# Patient Record
Sex: Female | Born: 1975
Health system: Southern US, Community
[De-identification: ages and names within clinical notes are randomized; demographics above are authoritative.]

## PROBLEM LIST (undated history)

## (undated) HISTORY — PX: APPENDECTOMY: SHX54

---

## 2000-12-17 ENCOUNTER — Encounter: Payer: Self-pay | Admitting: Emergency Medicine

## 2000-12-17 ENCOUNTER — Emergency Department (HOSPITAL_COMMUNITY): Admission: EM | Admit: 2000-12-17 | Discharge: 2000-12-17 | Payer: Self-pay | Admitting: Emergency Medicine

## 2006-10-23 ENCOUNTER — Emergency Department (HOSPITAL_COMMUNITY): Admission: EM | Admit: 2006-10-23 | Discharge: 2006-10-23 | Payer: Self-pay | Admitting: Emergency Medicine

## 2008-05-02 ENCOUNTER — Ambulatory Visit (HOSPITAL_COMMUNITY): Admission: RE | Admit: 2008-05-02 | Discharge: 2008-05-02 | Payer: Self-pay | Admitting: Unknown Physician Specialty

## 2008-05-11 ENCOUNTER — Inpatient Hospital Stay (HOSPITAL_COMMUNITY): Admission: AD | Admit: 2008-05-11 | Discharge: 2008-05-11 | Payer: Self-pay | Admitting: Obstetrics and Gynecology

## 2009-04-20 ENCOUNTER — Inpatient Hospital Stay (HOSPITAL_COMMUNITY): Admission: AD | Admit: 2009-04-20 | Discharge: 2009-04-23 | Payer: Self-pay | Admitting: Obstetrics and Gynecology

## 2009-10-16 IMAGING — US US OB TRANSVAGINAL MODIFY
1 series · 14 of 28 positions shown · non-contrast
Comparison: none

OBSTETRICAL ULTRASOUND:
 This ultrasound exam was performed in the [HOSPITAL] Ultrasound Department.  The OB US report was generated in the AS system, and faxed to the ordering physician.  This report is also available in [REDACTED] PACS.

[Series 1: us ob transvaginal modify · 0.26mm/px · 14 of 42 slices shown]
[im 2/42]
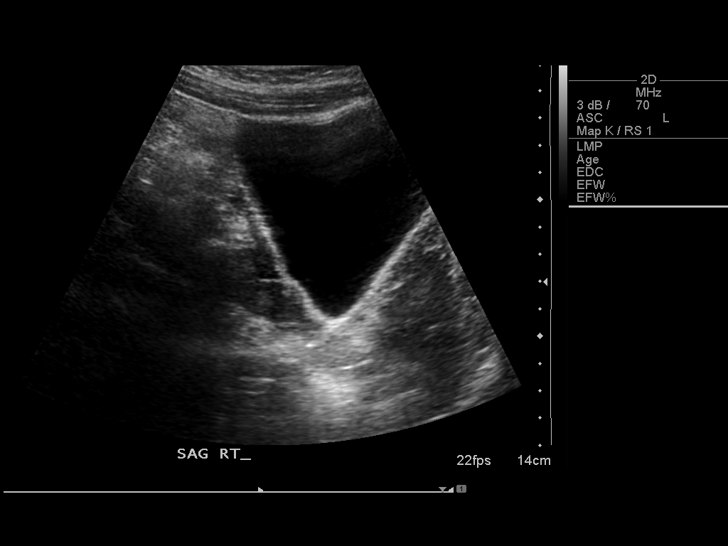
[im 5/42]
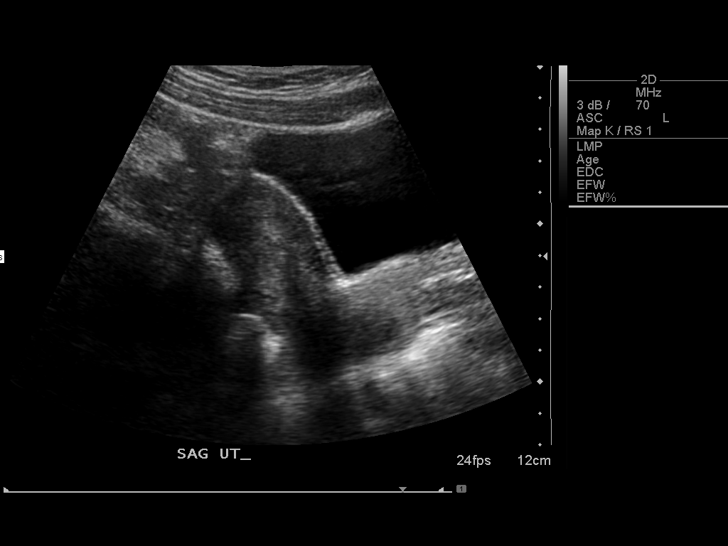
[im 8/42]
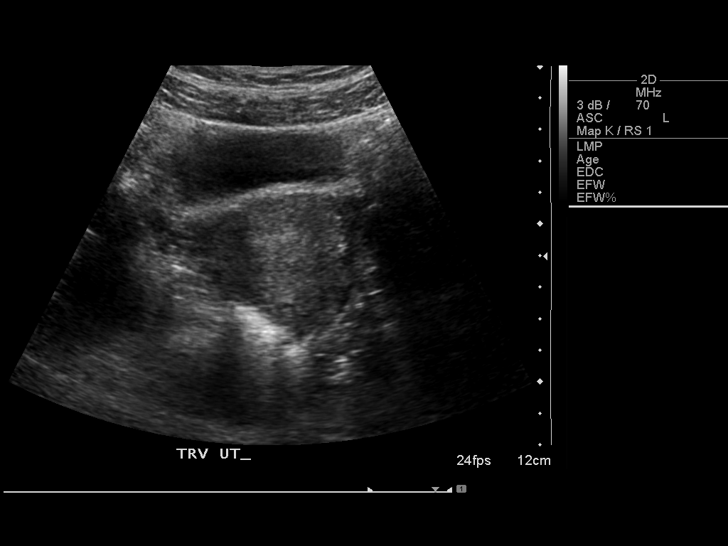
[im 11/42]
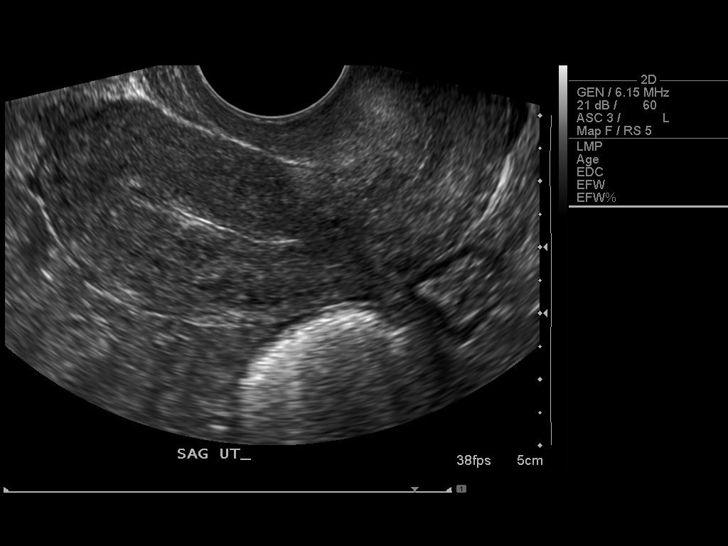
[im 14/42]
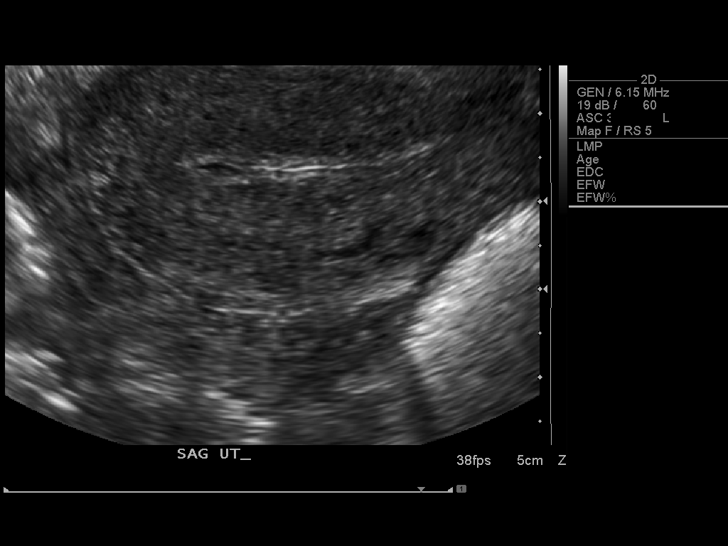
[im 17/42]
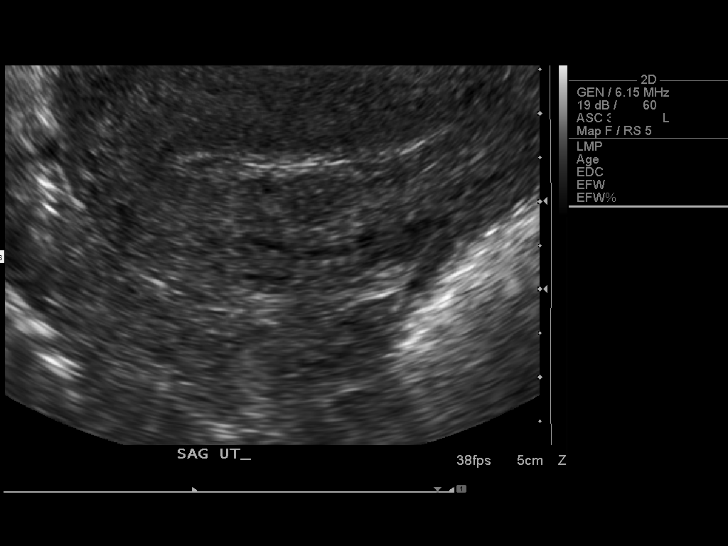
[im 20/42]
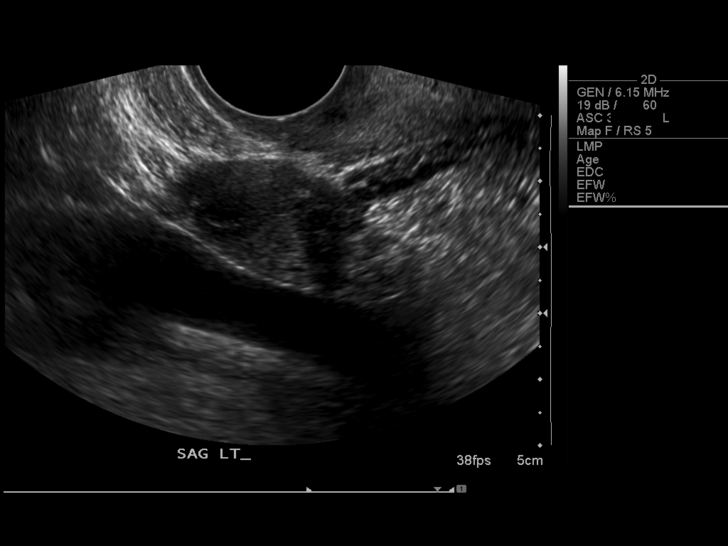
[im 23/42]
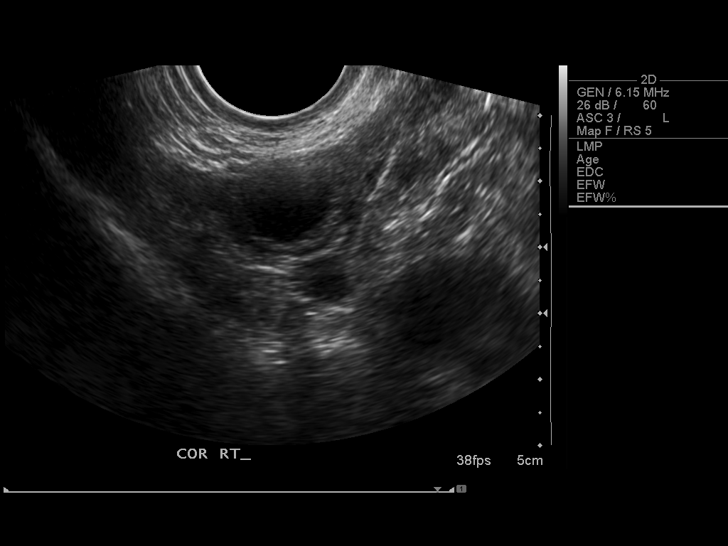
[im 26/42]
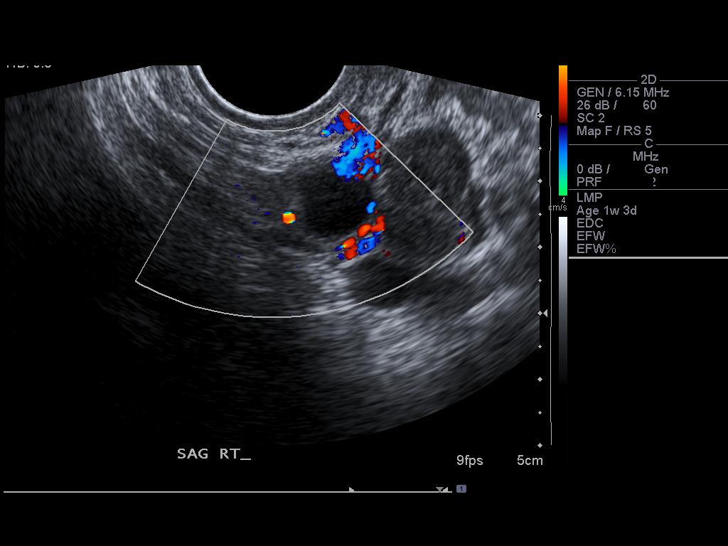
[im 29/42]
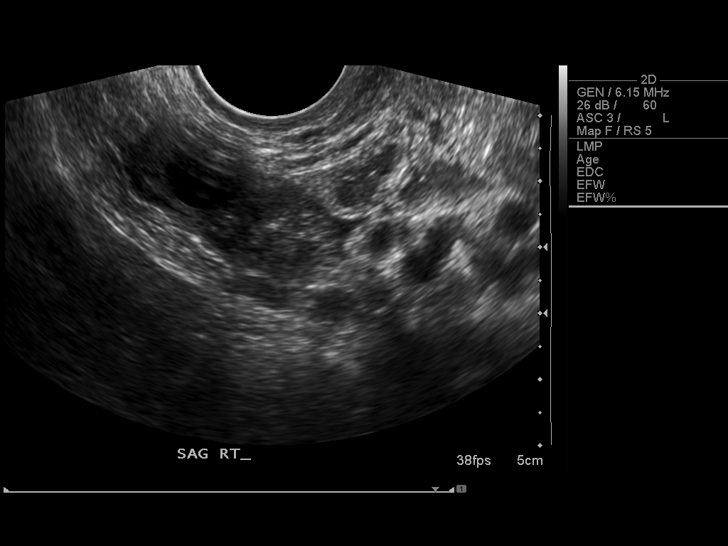
[im 32/42]
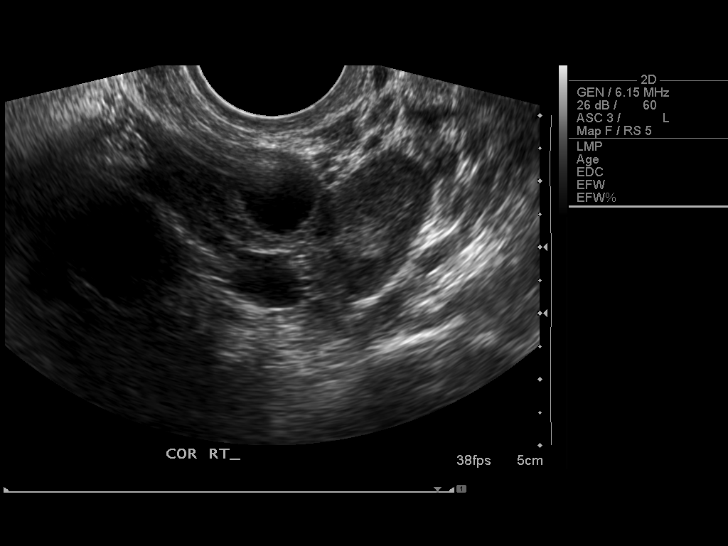
[im 35/42]
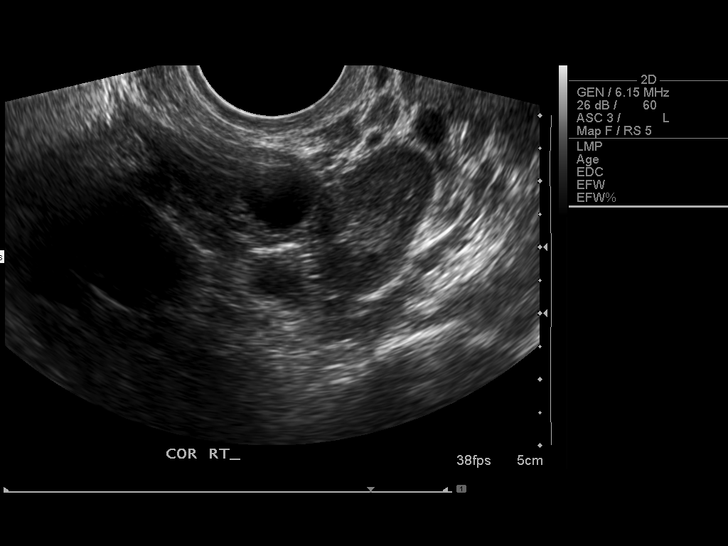
[im 38/42]
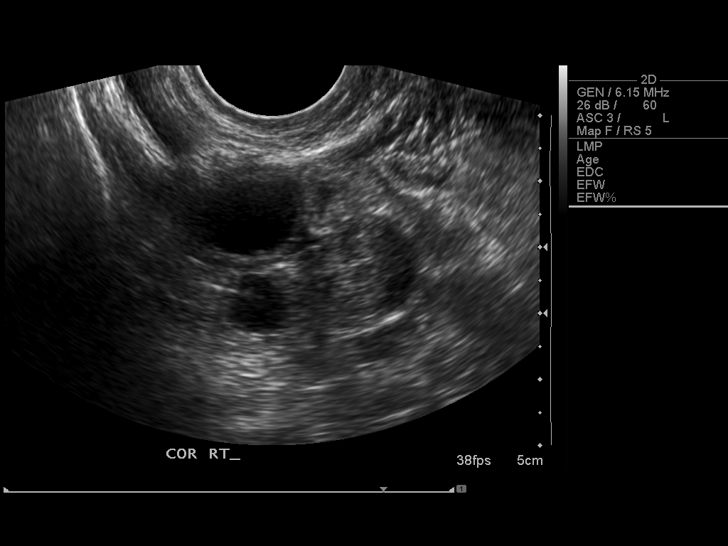
[im 42/42]
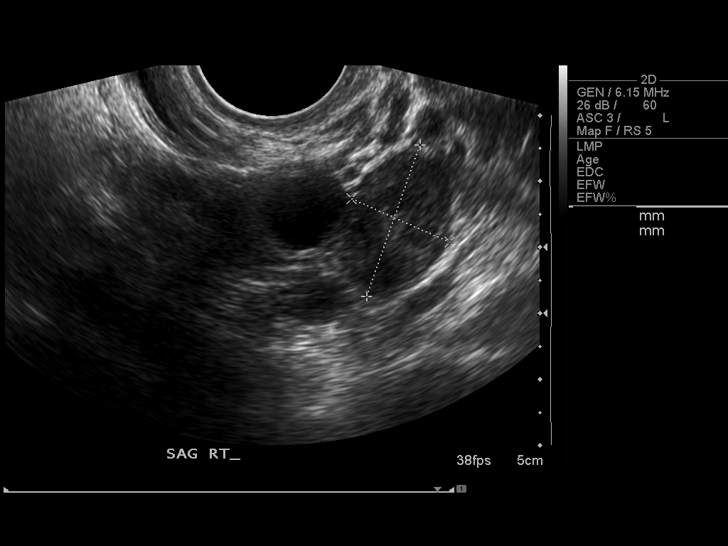

[14 of 28 positions shown; findings below may reference images not displayed]

IMPRESSION: See AS Obstetric US report.

## 2010-10-13 ENCOUNTER — Inpatient Hospital Stay (HOSPITAL_COMMUNITY): Admission: AD | Admit: 2010-10-13 | Discharge: 2010-10-13 | Payer: Self-pay | Admitting: Obstetrics & Gynecology

## 2010-10-13 ENCOUNTER — Ambulatory Visit: Payer: Self-pay | Admitting: Nurse Practitioner

## 2010-11-12 ENCOUNTER — Inpatient Hospital Stay (HOSPITAL_COMMUNITY): Admission: RE | Admit: 2010-11-12 | Discharge: 2010-11-14 | Payer: Self-pay | Admitting: Obstetrics and Gynecology

## 2011-03-09 LAB — CBC
HCT: 33.8 % — ABNORMAL LOW (ref 36.0–46.0)
HCT: 37.8 % (ref 36.0–46.0)
Hemoglobin: 11.3 g/dL — ABNORMAL LOW (ref 12.0–15.0)
Hemoglobin: 12.7 g/dL (ref 12.0–15.0)
MCH: 31 pg (ref 26.0–34.0)
MCH: 31.2 pg (ref 26.0–34.0)
MCHC: 33.4 g/dL (ref 30.0–36.0)
MCHC: 33.6 g/dL (ref 30.0–36.0)
MCV: 92.1 fL (ref 78.0–100.0)
MCV: 93.3 fL (ref 78.0–100.0)
Platelets: 168 10*3/uL (ref 150–400)
RBC: 4.11 MIL/uL (ref 3.87–5.11)
RDW: 14.1 % (ref 11.5–15.5)
RDW: 14.3 % (ref 11.5–15.5)
WBC: 14.1 10*3/uL — ABNORMAL HIGH (ref 4.0–10.5)

## 2011-03-09 LAB — RPR: RPR Ser Ql: NONREACTIVE

## 2011-03-10 LAB — URINALYSIS, ROUTINE W REFLEX MICROSCOPIC
Bilirubin Urine: NEGATIVE
Glucose, UA: NEGATIVE mg/dL
Ketones, ur: NEGATIVE mg/dL
Protein, ur: NEGATIVE mg/dL
Urobilinogen, UA: 0.2 mg/dL (ref 0.0–1.0)

## 2011-03-10 LAB — URINE MICROSCOPIC-ADD ON

## 2011-04-07 LAB — CBC
HCT: 27.9 % — ABNORMAL LOW (ref 36.0–46.0)
HCT: 38.9 % (ref 36.0–46.0)
Hemoglobin: 13.7 g/dL (ref 12.0–15.0)
MCHC: 34.6 g/dL (ref 30.0–36.0)
MCHC: 35.1 g/dL (ref 30.0–36.0)
MCV: 97.6 fL (ref 78.0–100.0)
Platelets: 121 10*3/uL — ABNORMAL LOW (ref 150–400)
RBC: 4.15 MIL/uL (ref 3.87–5.11)
WBC: 14.9 10*3/uL — ABNORMAL HIGH (ref 4.0–10.5)

## 2011-09-22 LAB — COMPREHENSIVE METABOLIC PANEL
Albumin: 3.7
BUN: 8
Creatinine, Ser: 0.82
GFR calc Af Amer: >60
Potassium: 3.4 — ABNORMAL LOW
Total Protein: 6.7

## 2011-09-22 LAB — CBC
HCT: 40.7
MCV: 90.4
Platelets: 209
RDW: 12.6

## 2012-03-20 ENCOUNTER — Encounter: Payer: Self-pay | Admitting: Emergency Medicine

## 2012-03-20 ENCOUNTER — Emergency Department
Admission: EM | Admit: 2012-03-20 | Discharge: 2012-03-20 | Disposition: A | Discharge status: Home / Self Care | Payer: Managed Care, Other (non HMO) | Source: Home / Self Care | Attending: Emergency Medicine | Admitting: Emergency Medicine

## 2012-03-20 ENCOUNTER — Emergency Department
Admit: 2012-03-20 | Discharge: 2012-03-20 | Disposition: A | Discharge status: Home / Self Care | Payer: Managed Care, Other (non HMO)

## 2012-03-20 DIAGNOSIS — M25539 Pain in unspecified wrist: Secondary | ICD-10-CM

## 2012-03-20 NOTE — ED Provider Notes (Signed)
History     CSN: [id number]  Arrival date & time 03/20/12  4098   First MD Initiated Contact with Patient 03/20/12 1834      Chief Complaint  Patient presents with  . Wrist Pain    (Consider location/radiation/quality/duration/timing/severity/associated sxs/prior treatment) HPI This is a 36 year old female who comes to clinic complaining of wrist pain today.  It is located in her right wrist and she describes it as a constant soreness and ranks it as 5/10.  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  She states that the pain is mostly located on the radial aspect and points to the radial distal radius.  She has not been using any medications or modalities yet since it only happened about an hour ago.  She states that she tripped and fell and hit wrist on a railing. No other injuries noted and no head injuries.   Past Medical History  Diagnosis Date  . Hematuria     Past Surgical History  Procedure Date  . Appendectomy     Family History  Problem Relation Age of Onset  . Diabetes Father   . Hypertension Father     History  Substance Use Topics  . Smoking status: Never Smoker   . Smokeless tobacco: Not on file  . Alcohol Use: No    OB History    Grav Para Term Preterm Abortions TAB SAB Ect Mult Living                  Review of Systems  All other systems reviewed and are negative.    Allergies  Amoxicillin; Clindamycin/lincomycin; and Zithromax  Home Medications   Current Outpatient Rx  Name Route Sig Dispense Refill  . LEVONORGESTREL 20 MCG/24HR IU IUD Intrauterine 1 each by Intrauterine route once.      BP 131/95  Pulse 76  Temp(Src) 98.3 F (36.8 C) (Oral)  Resp 18  Ht 5\' 6"  (1.676 m)  Wt 150 lb (68.04 kg)  BMI 24.21 kg/m2  SpO2 100%  Physical Exam  Nursing note and vitals reviewed. Constitutional: She is oriented to person, place, and time. She appears well-developed and well-nourished.    HENT:  Head: Normocephalic and atraumatic.  Eyes: No scleral icterus.  Neck: Neck supple.  Cardiovascular: Regular rhythm and normal heart sounds.   Pulmonary/Chest: Effort normal and breath sounds normal. No respiratory distress.  Musculoskeletal:       Right hand wrist examination demonstrates for range of motion with flexion, extension, supination, pronation, radial and ulnar deviation.  Minimal snuff box tenderness.  Most of her tenderness is located at the radial distal radius.  No swelling or bruising.  Distal neurovascular status is intact.  There is no ulnar-sided tenderness.  Neurological: She is alert and oriented to person, place, and time.  Skin: Skin is warm and dry.  Psychiatric: She has a normal mood and affect. Her speech is normal.    ED Course  Procedures (including critical care time)  Labs Reviewed - No data to display Dg Wrist Complete Right  03/20/2012  *RADIOLOGY REPORT*  Clinical Data: 36 year old female with right wrist pain following fall.  RIGHT WRIST - COMPLETE 3+ VIEW  Comparison: None  Findings: No evidence of acute fracture, subluxation or dislocation identified.  No radio-opaque foreign bodies are present.  No focal bony lesions are noted.  The joint spaces are unremarkable.  Mild negative ulnar variance is present.  IMPRESSION:  No evidence of acute abnormality.  Original Report Authenticated By: Rosendo Gros, M.D.     1. Pain, wrist       MDM   An x-ray was ordered and read by the radiologist as above. Encourage rest, ice, compression with ACE bandage and/or a brace, and elevation of injured body part.  The role of anti-inflammatories is discussed with the patient.  We placed her in a thumb spica splint to allow her to rest it for the next few days.  If after 7-10 days she is still having significant pain, I advised her to followup with sports medicine or should call us back for recommendations.  Marlaine Hind, MD 03/20/12 Ernestina [place]

## 2012-03-20 NOTE — ED Notes (Signed)
Fell 1 hour ago and caught herself with right wrist.

## 2012-10-15 ENCOUNTER — Emergency Department
Admission: EM | Admit: 2012-10-15 | Discharge: 2012-10-15 | Disposition: A | Discharge status: Home / Self Care | Payer: Managed Care, Other (non HMO) | Source: Home / Self Care | Attending: Family Medicine | Admitting: Family Medicine

## 2012-10-15 ENCOUNTER — Encounter: Payer: Self-pay | Admitting: *Deleted

## 2012-10-15 DIAGNOSIS — J111 Influenza due to unidentified influenza virus with other respiratory manifestations: Secondary | ICD-10-CM

## 2012-10-15 DIAGNOSIS — J029 Acute pharyngitis, unspecified: Secondary | ICD-10-CM

## 2012-10-15 LAB — POCT INFLUENZA A/B: Influenza A, POC: NEGATIVE

## 2012-10-15 LAB — POCT RAPID STREP A (OFFICE): Rapid Strep A Screen: NEGATIVE

## 2012-10-15 NOTE — ED Provider Notes (Signed)
History     CSN: [id number]  Arrival date & time 10/15/12  1647   First MD Initiated Contact with Patient 10/15/12 1710      Chief Complaint  Patient presents with  . Influenza      HPI Comments: Patient complains of awakening today with a mild sore throat, nasal congestion, myalgias, headache, fatigue, and fever to 102.  No cough. There has been no pleuritic pain, shortness of breath, or wheezes.  No GI or GU symptoms .  The history is provided by the patient.    Past Medical History  Diagnosis Date  . Hematuria     Past Surgical History  Procedure Date  . Appendectomy     Family History  Problem Relation Age of Onset  . Diabetes Father   . Hypertension Father   . Hyperlipidemia Father   . Heart disease Father     History  Substance Use Topics  . Smoking status: Never Smoker   . Smokeless tobacco: Not on file  . Alcohol Use: No    OB History    Grav Para Term Preterm Abortions TAB SAB Ect Mult Living                  Review of Systems + sore throat No cough No pleuritic pain No wheezing + nasal congestion + post-nasal drainage No sinus pain/pressure No itchy/red eyes No earache No hemoptysis No SOB + fever/chills No nausea No vomiting No abdominal pain No diarrhea No urinary symptoms No skin rashes + fatigue + myalgias + headache Used OTC meds without relief  Allergies  Amoxicillin; Clindamycin/lincomycin; Duricef; and Zithromax  Home Medications   Current Outpatient Rx  Name Route Sig Dispense Refill  . LEVONORGESTREL 20 MCG/24HR IU IUD Intrauterine 1 each by Intrauterine route once.      BP 118/80  Pulse 112  Temp 98 F (36.7 C) (Oral)  Resp 18  Ht 5\' 6"  (1.676 m)  Wt 154 lb (69.854 kg)  BMI 24.86 kg/m2  SpO2 99%  Physical Exam Nursing notes and Vital Signs reviewed. Appearance:  Patient appears healthy, stated age, and in no acute distress Eyes:  Pupils are equal, round, and reactive to light and accomodation.   Extraocular movement is intact.  Conjunctivae are not inflamed  Ears:  Canals normal.  Tympanic membranes normal.  Nose:  Mildly congested turbinates.  No sinus tenderness.  Pharynx:  Normal Neck:  Supple.  Slightly tender shotty posterior nodes are palpated bilaterally  Lungs:  Clear to auscultation.  Breath sounds are equal.  Heart:  Regular rate and rhythm without murmurs, rubs, or gallops.  Abdomen:  Nontender without masses or hepatosplenomegaly.  Bowel sounds are present.  No CVA or flank tenderness.  Extremities:  No edema.  No calf tenderness Skin:  No rash present.   ED Course  Procedures none   Labs Reviewed  POCT RAPID STREP A (OFFICE) negative  POCT INFLUENZA A/B negative  STREP A DNA PROBE pending   Narrative:    Performed at:  [facility]                [address]                [place], [place] [address]      1. Influenza-like illness   2. Acute pharyngitis       MDM  Throat culture pending  There is no evidence of bacterial infection today.   Treat  symptomatically for now  Increase fluids, rest.  May take Ibuprofen 200mg , 4 tabs every 8 hours with food for headache, fever, body aches, etc. Followup with Family Doctor if not improved in one week.          Lattie Haw, MD 10/18/12 [phone]

## 2012-10-15 NOTE — ED Notes (Signed)
Patient c/o bodyache, fever, sore throat started this morning. Patient believes she has the flu.

## 2012-10-17 LAB — STREP A DNA PROBE: GASP: NEGATIVE

## 2013-09-02 ENCOUNTER — Emergency Department (INDEPENDENT_AMBULATORY_CARE_PROVIDER_SITE_OTHER)
Admission: EM | Admit: 2013-09-02 | Discharge: 2013-09-02 | Disposition: A | Discharge status: Home / Self Care | Payer: Managed Care, Other (non HMO) | Source: Home / Self Care | Attending: Family Medicine | Admitting: Family Medicine

## 2013-09-02 ENCOUNTER — Emergency Department
Admission: EM | Admit: 2013-09-02 | Discharge: 2013-09-02 | Disposition: A | Discharge status: Still In Hospital | Payer: Managed Care, Other (non HMO) | Source: Home / Self Care | Attending: Family Medicine | Admitting: Family Medicine

## 2013-09-02 DIAGNOSIS — J351 Hypertrophy of tonsils: Secondary | ICD-10-CM

## 2013-09-02 DIAGNOSIS — J020 Streptococcal pharyngitis: Secondary | ICD-10-CM

## 2013-09-02 LAB — POCT RAPID STREP A (OFFICE): Rapid Strep A Screen: POSITIVE — AB

## 2013-09-02 MED ORDER — CEPHALEXIN 500 MG PO CAPS: 500.0000 mg | ORAL_CAPSULE | Freq: Two times a day (BID) | ORAL | Status: DC

## 2013-09-02 MED ORDER — METHYLPREDNISOLONE SODIUM SUCC 125 MG IJ SOLR
125.0000 mg | Freq: Once | INTRAMUSCULAR | Status: AC
  Administered 2013-09-02: 125 mg via INTRAVENOUS

## 2013-09-02 NOTE — ED Notes (Deleted)
Patient c/o sore throat, cough, congestion, and body aches x 3 days. Patient has taken OTC Tylenol with no relief.

## 2013-09-02 NOTE — ED Provider Notes (Signed)
CSN: [id number]     Arrival date & time 09/02/13  1115 History   First MD Initiated Contact with Patient 09/02/13 1125     Chief Complaint  Patient presents with  . Sore Throat    HPI  SORE THROAT  Onset: 2 days  Description: noticed mild sore throat last night. Woke up this am with marked sore throat. Mild trouble swallowing.  Modifying factors: none  Symptoms  Fever:  no URI symptoms: no Cough: no Headache: no Rash:  no Swollen glands:   yes Recent Strep Exposure: unsure LUQ pain: no Heartburn/brash: no Allergy Symptoms: no  Red Flags STD exposure: no Breathing difficulty: no Drooling: no Trismus: no   Past Medical History  Diagnosis Date  . Hematuria    Past Surgical History  Procedure Laterality Date  . Appendectomy     Family History  Problem Relation Age of Onset  . Diabetes Father   . Hypertension Father   . Hyperlipidemia Father   . Heart disease Father   . Cancer Other    History  Substance Use Topics  . Smoking status: Never Smoker   . Smokeless tobacco: Not on file  . Alcohol Use: No   OB History   Grav Para Term Preterm Abortions TAB SAB Ect Mult Living                 Review of Systems  All other systems reviewed and are negative.    Allergies  Amoxicillin; Clindamycin/lincomycin; Duricef; and Zithromax  Home Medications   Current Outpatient Rx  Name  Route  Sig  Dispense  Refill  . cephALEXin (KEFLEX) 500 MG capsule   Oral   Take 1 capsule (500 mg total) by mouth 2 (two) times daily.   20 capsule   0   . levonorgestrel (MIRENA) 20 MCG/24HR IUD   Intrauterine   1 each by Intrauterine route once.          BP 102/69  Pulse 104  Temp(Src) 98.1 F (36.7 C) (Oral)  Resp 18  Ht 5\' 6"  (1.676 m)  Wt 154 lb (69.854 kg)  BMI 24.87 kg/m2  SpO2 100%  LMP 07/30/2013 Physical Exam  Constitutional: She appears well-developed and well-nourished.  HENT:  Head: Normocephalic and atraumatic.  Right Ear: External ear normal.   Left Ear: External ear normal.  Marked bilateral tonsillar hypertrophy, erythema, and exudates.    Eyes: Conjunctivae are normal. Pupils are equal, round, and reactive to light.  Neck: Normal range of motion. Neck supple.  Cardiovascular: Normal rate, regular rhythm and normal heart sounds.   Pulmonary/Chest: Effort normal and breath sounds normal.  Abdominal: Soft. Bowel sounds are normal.  Lymphadenopathy:    She has cervical adenopathy.  Neurological: She is alert.  Skin: Skin is warm.    ED Course  Procedures (including critical care time) Labs Review Labs Reviewed - No data to display Imaging Review No results found.  MDM   1. Strep pharyngitis    Will treat with Keflex (multiple medication allergies. Patient is taking Keflex for strep throat in the past with no reactions) Solu-Medrol given market throat pain and tonsillar hypertrophy. No ENT red flags on exam including drooling and trismus. No respiratory distress. Discuss infectious and ENT red flags in length with patient. Followup as needed.     The patient and/or caregiver has been counseled thoroughly with regard to treatment plan and/or medications prescribed including dosage, schedule, interactions, rationale for use, and possible side effects and they  verbalize understanding. Diagnoses and expected course of recovery discussed and will return if not improved as expected or if the condition worsens. Patient and/or caregiver verbalized understanding.         Doree Albee, MD 09/02/13 1149

## 2013-09-02 NOTE — ED Notes (Signed)
Sore throat and fever started last night.  Took Advil this am

## 2013-10-11 ENCOUNTER — Emergency Department
Admission: EM | Admit: 2013-10-11 | Discharge: 2013-10-11 | Disposition: A | Discharge status: Home / Self Care | Payer: Managed Care, Other (non HMO) | Source: Home / Self Care | Attending: Emergency Medicine | Admitting: Emergency Medicine

## 2013-10-11 ENCOUNTER — Encounter: Payer: Self-pay | Admitting: Emergency Medicine

## 2013-10-11 DIAGNOSIS — J029 Acute pharyngitis, unspecified: Secondary | ICD-10-CM

## 2013-10-11 MED ORDER — CEPHALEXIN 500 MG PO CAPS: 500.0000 mg | ORAL_CAPSULE | Freq: Three times a day (TID) | ORAL | Status: DC

## 2013-10-11 MED ORDER — METHYLPREDNISOLONE SODIUM SUCC 125 MG IJ SOLR
125.0000 mg | Freq: Once | INTRAMUSCULAR | Status: AC
  Administered 2013-10-11: 125 mg via INTRAMUSCULAR

## 2013-10-11 NOTE — ED Provider Notes (Signed)
CSN: [id number]     Arrival date & time 10/11/13  1406 History   First MD Initiated Contact with Patient 10/11/13 1416     Chief Complaint  Patient presents with  . Sore Throat   (Consider location/radiation/quality/duration/timing/severity/associated sxs/prior Treatment) HPI Paula Collins is a 37 y.o. female who complains of onset of cold symptoms for 1 day.  The symptoms are constant and mild-moderate in severity.  Had strep throat last month treated with Keflex successfully as well as [employer] role.  Then symptoms began last night.  She had changed her toothbrush. + sore throat No cough No pleuritic pain No wheezing No nasal congestion No post-nasal drainage No sinus pain/pressure No chest congestion No itchy/red eyes No earache No hemoptysis No SOB No chills/sweats No fever No nausea No vomiting No abdominal pain No diarrhea No skin rashes No fatigue + myalgias No headache    Past Medical History  Diagnosis Date  . Hematuria    Past Surgical History  Procedure Laterality Date  . Appendectomy     Family History  Problem Relation Age of Onset  . Diabetes Father   . Hypertension Father   . Hyperlipidemia Father   . Heart disease Father   . Cancer Other    History  Substance Use Topics  . Smoking status: Never Smoker   . Smokeless tobacco: Not on file  . Alcohol Use: No   OB History   Grav Para Term Preterm Abortions TAB SAB Ect Mult Living                 Review of Systems  All other systems reviewed and are negative.    Allergies  Amoxicillin; Clindamycin/lincomycin; Duricef; and Zithromax  Home Medications   Current Outpatient Rx  Name  Route  Sig  Dispense  Refill  . cephALEXin (KEFLEX) 500 MG capsule   Oral   Take 1 capsule (500 mg total) by mouth 2 (two) times daily.   20 capsule   0   . cephALEXin (KEFLEX) 500 MG capsule   Oral   Take 1 capsule (500 mg total) by mouth 3 (three) times daily.   30 capsule   0   . levonorgestrel  (MIRENA) 20 MCG/24HR IUD   Intrauterine   1 each by Intrauterine route once.          BP 116/76  Pulse 122  Temp(Src) 98.2 F (36.8 C) (Oral)  Ht 5\' 6"  (1.676 m)  Wt 156 lb (70.761 kg)  BMI 25.19 kg/m2  SpO2 100% Physical Exam  Nursing note and vitals reviewed. Constitutional: She is oriented to person, place, and time. She appears well-developed and well-nourished.  HENT:  Head: Normocephalic and atraumatic.  Right Ear: Tympanic membrane, external ear and ear canal normal.  Left Ear: Tympanic membrane, external ear and ear canal normal.  Nose: Nose normal.  Mouth/Throat: Uvula is midline. Oropharyngeal exudate and posterior oropharyngeal erythema present.  3+ tonsils, not touching.  Oropharynx patent.  Mild anterior cervical lymphadenopathy.  Eyes: No scleral icterus.  Neck: Neck supple.  Cardiovascular: Regular rhythm and normal heart sounds.   Pulmonary/Chest: Effort normal and breath sounds normal. No respiratory distress.  Neurological: She is alert and oriented to person, place, and time.  Skin: Skin is warm and dry.  Psychiatric: She has a normal mood and affect. Her speech is normal.    ED Course  Procedures (including critical care time) Labs Review Labs Reviewed - No data to display Imaging Review No results found.  EKG Interpretation     Ventricular Rate:    PR Interval:    QRS Duration:   QT Interval:    QTC Calculation:   R Axis:     Text Interpretation:              MDM   1. Acute pharyngitis    1)  Take the prescribed antibiotic as instructed. + Strep test.  Throw out toothbrush.  Don't work for 24-48 hours.  If recurs, will need to see ENT. 2)  Use nasal saline solution (over the counter) at least 3 times a day. 3)  Use over the counter decongestants like Zyrtec-D every 12 hours as needed to help with congestion.  If you have hypertension, do not take medicines with sudafed.  4)  Can take tylenol every 6 hours or motrin every 8 hours  for pain or fever. 5)  Follow up with your primary doctor if no improvement in 5-7 days, sooner if increasing pain, fever, or new symptoms.      Marlaine Hind, MD 10/11/13 1430

## 2013-10-11 NOTE — ED Notes (Signed)
Sore throat last night

## 2014-01-08 ENCOUNTER — Emergency Department
Admission: EM | Admit: 2014-01-08 | Discharge: 2014-01-08 | Disposition: A | Discharge status: Home / Self Care | Payer: Managed Care, Other (non HMO) | Source: Home / Self Care | Attending: Emergency Medicine | Admitting: Emergency Medicine

## 2014-01-08 ENCOUNTER — Encounter: Payer: Self-pay | Admitting: Emergency Medicine

## 2014-01-08 DIAGNOSIS — J029 Acute pharyngitis, unspecified: Secondary | ICD-10-CM

## 2014-01-08 DIAGNOSIS — J0300 Acute streptococcal tonsillitis, unspecified: Secondary | ICD-10-CM

## 2014-01-08 DIAGNOSIS — J020 Streptococcal pharyngitis: Secondary | ICD-10-CM

## 2014-01-08 LAB — POCT RAPID STREP A (OFFICE): Rapid Strep A Screen: POSITIVE — AB

## 2014-01-08 MED ORDER — METHYLPREDNISOLONE ACETATE 80 MG/ML IJ SUSP
80.0000 mg | Freq: Once | INTRAMUSCULAR | Status: AC
  Administered 2014-01-08: 80 mg via INTRAMUSCULAR

## 2014-01-08 MED ORDER — CEPHALEXIN 500 MG PO CAPS: 500.0000 mg | ORAL_CAPSULE | Freq: Three times a day (TID) | ORAL | Status: DC

## 2014-01-08 NOTE — ED Provider Notes (Signed)
CSN: [id number]     Arrival date & time 01/08/14  1334 History   First MD Initiated Contact with Patient 01/08/14 1404     Chief Complaint  Patient presents with  . Sore Throat   (Consider location/radiation/quality/duration/timing/severity/associated sxs/prior Treatment) HPI Pt c/o sore throat, post nasal drip, and swollen tonsils x 1 day. Denies fever. She took advil at 0400 today. --Helped a little  SORE THROAT Onset: 1 day    Severity: Severe Tried OTC meds without significant relief.  Symptoms:  + Fever  + Swollen neck glands No Recent Strep Exposure     No Myalgias No Headache No Rash  No Discolored Nasal Mucus No Allergy symptoms No sinus pain/pressure No itchy/red eyes No earache  No Drooling No Trismus  No Nausea No Vomiting No Abdominal pain No Diarrhea No Reflux symptoms  No Cough No Breathing Difficulty No Shortness of Breath No pleuritic pain No Wheezing No Hemoptysis    Past Medical History  Diagnosis Date  . Hematuria    Past Surgical History  Procedure Laterality Date  . Appendectomy     Family History  Problem Relation Age of Onset  . Diabetes Father   . Hypertension Father   . Hyperlipidemia Father   . Heart disease Father   . Cancer Other    History  Substance Use Topics  . Smoking status: Never Smoker   . Smokeless tobacco: Not on file  . Alcohol Use: Yes   OB History   Grav Para Term Preterm Abortions TAB SAB Ect Mult Living                 Review of Systems  All other systems reviewed and are negative.    Allergies  Amoxicillin; Clindamycin/lincomycin; Duricef; and Zithromax  Home Medications   Current Outpatient Rx  Name  Route  Sig  Dispense  Refill  . cephALEXin (KEFLEX) 500 MG capsule   Oral   Take 1 capsule (500 mg total) by mouth 2 (two) times daily.   20 capsule   0   . cephALEXin (KEFLEX) 500 MG capsule   Oral   Take 1 capsule (500 mg total) by mouth 3 (three) times daily.   30 capsule    0   . cephALEXin (KEFLEX) 500 MG capsule   Oral   Take 1 capsule (500 mg total) by mouth 3 (three) times daily. X10 days   30 capsule   0     Patient states that she has taken Keflex many time ...   . levonorgestrel (MIRENA) 20 MCG/24HR IUD   Intrauterine   1 each by Intrauterine route once.          BP 111/76  Pulse 116  Temp(Src) 98.4 F (36.9 C) (Oral)  Resp 18  Ht 5\' 6"  (1.676 m)  Wt 152 lb (68.947 kg)  BMI 24.55 kg/m2  SpO2 100%  LMP 12/24/2013 Physical Exam  Nursing note and vitals reviewed. Constitutional: She is oriented to person, place, and time. She appears well-developed and well-nourished.  Non-toxic appearance. She appears ill. No distress.  HENT:  Head: Normocephalic and atraumatic.  Right Ear: Tympanic membrane, external ear and ear canal normal.  Left Ear: Tympanic membrane, external ear and ear canal normal.  Nose: Nose normal. Right sinus exhibits no maxillary sinus tenderness and no frontal sinus tenderness. Left sinus exhibits no maxillary sinus tenderness and no frontal sinus tenderness.  Mouth/Throat: Uvula is midline and mucous membranes are normal. No oral lesions. Posterior oropharyngeal  erythema present. No oropharyngeal exudate or tonsillar abscesses.  + Tonsillar enlargement.  Airway intact.  Eyes: Conjunctivae are normal. No scleral icterus.  Neck: Neck supple.  Cardiovascular: Normal rate, regular rhythm and normal heart sounds.   No murmur heard. Pulmonary/Chest: Effort normal and breath sounds normal. No stridor. No respiratory distress. She has no wheezes. She has no rhonchi. She has no rales.  Abdominal: Soft. She exhibits no mass. There is no hepatosplenomegaly. There is no tenderness.  Lymphadenopathy:    She has cervical adenopathy.       Right cervical: Superficial cervical adenopathy present. No deep cervical and no posterior cervical adenopathy present.      Left cervical: Superficial cervical adenopathy present. No deep  cervical and no posterior cervical adenopathy present.  Neurological: She is alert and oriented to person, place, and time.  Skin: Skin is warm. No rash noted.  Psychiatric: She has a normal mood and affect.    ED Course  Procedures (including critical care time) Labs Review Labs Reviewed  POCT RAPID STREP A (OFFICE) - Abnormal; Notable for the following:    Rapid Strep A Screen Positive (*)    All other components within normal limits   Imaging Review No results found.  EKG Interpretation    Date/Time:    Ventricular Rate:    PR Interval:    QRS Duration:   QT Interval:    QTC Calculation:   R Axis:     Text Interpretation:              MDM   1. Acute streptococcal tonsillitis    Rapid strep test positive. Treatment options discussed, as well as risks, benefits, alternatives.  She requested a steroid shot IM in addition to antibiotics, and she states that always helps when she gets tonsillitis. She also requested Keflex, as that is always resolved strep which she's had in the past. She denies ever having any reaction or problems when she is taken Keflex in the past. (Note hx drug allergies to amoxicillin, clindamycin, duricef, and Zithromax which she states has caused a rash in the past) Rx: Keflex 500 mg by mouth 3 times a day x10 days. Names of ENTs given to followup within 10 days, sooner if worse or new symptoms  Precautions discussed. Red flags discussed. Questions invited and answered. Patient voiced understanding and agreement.    Lajean Manesavid Massey, MD 01/08/14 [id number]

## 2014-01-08 NOTE — ED Notes (Signed)
Pt c/o sore throat, post nasal drip, and swollen tonsils x 1 day. Denies fever. She took advil at 0400 today.

## 2019-10-26 DIAGNOSIS — Z124 Encounter for screening for malignant neoplasm of cervix: Secondary | ICD-10-CM | POA: Diagnosis not present

## 2019-10-26 DIAGNOSIS — Z6822 Body mass index (BMI) 22.0-22.9, adult: Secondary | ICD-10-CM | POA: Diagnosis not present

## 2019-10-26 DIAGNOSIS — Z01419 Encounter for gynecological examination (general) (routine) without abnormal findings: Secondary | ICD-10-CM | POA: Diagnosis not present

## 2019-10-26 LAB — HM PAP SMEAR: HM Pap smear: NEGATIVE

## 2020-09-11 ENCOUNTER — Encounter: Payer: Self-pay | Admitting: Medical-Surgical

## 2020-09-11 ENCOUNTER — Ambulatory Visit (INDEPENDENT_AMBULATORY_CARE_PROVIDER_SITE_OTHER): Payer: BC Managed Care – PPO | Admitting: Medical-Surgical

## 2020-09-11 ENCOUNTER — Other Ambulatory Visit: Payer: Self-pay

## 2020-09-11 VITALS — BP 113/76 | HR 75 | Temp 98.7°F | Ht 65.5 in | Wt 143.3 lb

## 2020-09-11 DIAGNOSIS — Z0000 Encounter for general adult medical examination without abnormal findings: Secondary | ICD-10-CM | POA: Diagnosis not present

## 2020-09-11 DIAGNOSIS — Z23 Encounter for immunization: Secondary | ICD-10-CM

## 2020-09-11 DIAGNOSIS — Z114 Encounter for screening for human immunodeficiency virus [HIV]: Secondary | ICD-10-CM

## 2020-09-11 DIAGNOSIS — R319 Hematuria, unspecified: Secondary | ICD-10-CM

## 2020-09-11 DIAGNOSIS — G479 Sleep disorder, unspecified: Secondary | ICD-10-CM | POA: Diagnosis not present

## 2020-09-11 DIAGNOSIS — H5500 Unspecified nystagmus: Secondary | ICD-10-CM

## 2020-09-11 DIAGNOSIS — Z7689 Persons encountering health services in other specified circumstances: Secondary | ICD-10-CM | POA: Diagnosis not present

## 2020-09-11 DIAGNOSIS — Z1159 Encounter for screening for other viral diseases: Secondary | ICD-10-CM

## 2020-09-11 NOTE — Patient Instructions (Signed)

## 2020-09-11 NOTE — Progress Notes (Signed)
New Patient Office Visit  Subjective:  Patient ID: Paula Collins, female    DOB: Dec 17, 1976  Age: 44 y.o. MRN: [id number]  CC:  Chief Complaint  Patient presents with  . Establish Care    HPI Paula Collins presents to establish care.  Works from home in [employer]. Gained 10lbs over the last year to year and half.  Intermittent hematuria- workup at 15 negative for concerns.  Continues to have intermittent bouts of hematuria without other symptoms.  Nystagmus-history of lateral nystagmus since before she was 44 years old.  Had a full evaluation with no clear explanation.  Notes that the nystagmus is worse when she gets tired.  Difficulty sleeping-notes that her 68-year-old has some difficulty sleeping and often wakes her at night.  Averages 5 hours of sleep per night or less.  Once she is awakened, she has difficulty getting back to sleep.  She has used melatonin to help sleep onset which was effective but she felt hung over when she was awakened by her child and notes this is not an optimal situation.  They are hoping to have some answers regarding her child's sleep issues and once addressed, she is hoping to return to sleeping well.  No concerns today.  Past Medical History:  Diagnosis Date  . Hematuria     Past Surgical History:  Procedure Laterality Date  . APPENDECTOMY      Family History  Problem Relation Age of Onset  . Diabetes Father   . Hypertension Father   . Hyperlipidemia Father   . Heart disease Father   . Heart attack Father   . Cancer Other     Social History   Socioeconomic History  . Marital status: Married    Spouse name: Not on file  . Number of children: Not on file  . Years of education: Not on file  . Highest education level: Not on file  Occupational History  . Not on file  Tobacco Use  . Smoking status: Never Smoker  . Smokeless tobacco: Never Used  Vaping Use  . Vaping Use: Never used  Substance and Sexual Activity  . Alcohol use: Yes     Alcohol/week: 3.0 - 4.0 standard drinks    Types: 3 - 4 Glasses of wine per week  . Drug use: No  . Sexual activity: Yes    Birth control/protection: I.U.D.  Other Topics Concern  . Not on file  Social History Narrative  . Not on file   Social Determinants of Health   Financial Resource Strain:   . Difficulty of Paying Living Expenses: Not on file  Food Insecurity:   . Worried About Programme researcher, broadcasting/film/video in the Last Year: Not on file  . Ran Out of Food in the Last Year: Not on file  Transportation Needs:   . Lack of Transportation (Medical): Not on file  . Lack of Transportation (Non-Medical): Not on file  Physical Activity:   . Days of Exercise per Week: Not on file  . Minutes of Exercise per Session: Not on file  Stress:   . Feeling of Stress : Not on file  Social Connections:   . Frequency of Communication with Friends and Family: Not on file  . Frequency of Social Gatherings with Friends and Family: Not on file  . Attends Religious Services: Not on file  . Active Member of Clubs or Organizations: Not on file  . Attends Banker Meetings: Not on file  . Marital  Status: Not on file  Intimate Partner Violence:   . Fear of Current or Ex-Partner: Not on file  . Emotionally Abused: Not on file  . Physically Abused: Not on file  . Sexually Abused: Not on file    ROS Review of Systems  Constitutional: Negative for chills, fatigue, fever and unexpected weight change.  HENT: Negative for congestion, hearing loss, rhinorrhea and sore throat.   Eyes: Negative for visual disturbance.  Respiratory: Negative for cough, chest tightness, shortness of breath and wheezing.   Cardiovascular: Negative for chest pain, palpitations and leg swelling.  Gastrointestinal: Negative for abdominal pain, constipation, diarrhea, nausea and vomiting.  Endocrine: Negative for cold intolerance, heat intolerance, polydipsia, polyphagia and polyuria.  Genitourinary: Negative for dysuria,  frequency, hematuria and urgency.  Neurological: Negative for dizziness, seizures, weakness, light-headedness and headaches.  Hematological: Does not bruise/bleed easily.  Psychiatric/Behavioral: Positive for sleep disturbance. Negative for dysphoric mood, self-injury and suicidal ideas. The patient is not nervous/anxious.     Objective:   Today's Vitals: BP 113/76   Pulse 75   Temp 98.7 F (37.1 C) (Oral)   Ht 5' 5.5" (1.664 m)   Wt 143 lb 4.8 oz (65 kg)   SpO2 100%   BMI 23.48 kg/m   Physical Exam Vitals reviewed.  Constitutional:      General: She is not in acute distress.    Appearance: Normal appearance.  HENT:     Head: Normocephalic and atraumatic.     Right Ear: Tympanic membrane, ear canal and external ear normal.     Left Ear: Tympanic membrane, ear canal and external ear normal.     Ears:     Comments: Very small ear canals with moderate gold/brown cerumen    Mouth/Throat:     Mouth: Mucous membranes are moist.     Pharynx: No oropharyngeal exudate or posterior oropharyngeal erythema.     Comments: Bilaterally enlarged tonsils Eyes:     Extraocular Movements: Extraocular movements intact.     Conjunctiva/sclera: Conjunctivae normal.     Pupils: Pupils are equal, round, and reactive to light.  Cardiovascular:     Rate and Rhythm: Normal rate and regular rhythm.     Pulses: Normal pulses.     Heart sounds: Normal heart sounds. No murmur heard.  No friction rub. No gallop.   Pulmonary:     Effort: Pulmonary effort is normal. No respiratory distress.     Breath sounds: Normal breath sounds. No wheezing.  Abdominal:     General: Bowel sounds are normal. There is no distension.     Palpations: Abdomen is soft.     Tenderness: There is no abdominal tenderness. There is no guarding.  Musculoskeletal:        General: Normal range of motion.     Cervical back: Normal range of motion and neck supple. No tenderness.  Lymphadenopathy:     Cervical: No cervical  adenopathy.  Skin:    General: Skin is warm and dry.  Neurological:     Mental Status: She is alert and oriented to person, place, and time. Mental status is at baseline.     Comments: Bilateral lateral nystagmus  Psychiatric:        Mood and Affect: Mood normal.        Behavior: Behavior normal.        Thought Content: Thought content normal.        Judgment: Judgment normal.     Assessment & Plan:   1.  Encounter to establish care Reviewed available information and discussed health concerns with patient.  She is up-to-date on her Pap smear and mammogram.  2. Preventative health care Annual physical exam completed today.  Checking CBC, CMP, and lipid panel. - CBC - COMPLETE METABOLIC PANEL WITH GFR - Lipid panel  3. Nystagmus Stable without intervention.  4. Difficulty sleeping Discussed options to improve sleep.  At this time she would like to avoid sedating medications as she needs to be available for her child.  5. Hematuria, unspecified type Stable without intervention.  No recent hematuria episodes.  6. Need for influenza vaccination Flu shot given in office today. - Flu Vaccine QUAD 36+ mos IM  7. Need for hepatitis C screening test We will defer testing at this time as she believes this was done when she was pregnant.  Requesting records.  8. Screening for HIV (human immunodeficiency virus) Deferring testing at this time as she believes this was also done when she was pregnant.  Requesting records.   Outpatient Encounter Medications as of 09/11/2020  Medication Sig  . levonorgestrel (MIRENA) 20 MCG/24HR IUD 1 each by Intrauterine route once.  . Multiple Vitamin tablet Take 1 tablet by mouth daily.  . [DISCONTINUED] cephALEXin (KEFLEX) 500 MG capsule Take 1 capsule (500 mg total) by mouth 2 (two) times daily.  . [DISCONTINUED] cephALEXin (KEFLEX) 500 MG capsule Take 1 capsule (500 mg total) by mouth 3 (three) times daily.  . [DISCONTINUED] cephALEXin (KEFLEX)  500 MG capsule Take 1 capsule (500 mg total) by mouth 3 (three) times daily. X10 days   No facility-administered encounter medications on file as of 09/11/2020.    Follow-up: Return in about 1 year (around 09/11/2021) for annual physical exam or sooner if needed.   Thayer Ohm, DNP, APRN, FNP-BC [facility]

## 2020-10-28 DIAGNOSIS — Z1231 Encounter for screening mammogram for malignant neoplasm of breast: Secondary | ICD-10-CM | POA: Diagnosis not present

## 2020-10-28 DIAGNOSIS — Z01419 Encounter for gynecological examination (general) (routine) without abnormal findings: Secondary | ICD-10-CM | POA: Diagnosis not present

## 2020-10-28 DIAGNOSIS — Z6823 Body mass index (BMI) 23.0-23.9, adult: Secondary | ICD-10-CM | POA: Diagnosis not present

## 2021-02-04 ENCOUNTER — Encounter: Payer: Self-pay | Admitting: Medical-Surgical

## 2021-02-04 ENCOUNTER — Ambulatory Visit (INDEPENDENT_AMBULATORY_CARE_PROVIDER_SITE_OTHER): Payer: BC Managed Care – PPO | Admitting: Medical-Surgical

## 2021-02-04 ENCOUNTER — Other Ambulatory Visit: Payer: Self-pay

## 2021-02-04 VITALS — BP 117/80 | HR 74

## 2021-02-04 DIAGNOSIS — H01119 Allergic dermatitis of unspecified eye, unspecified eyelid: Secondary | ICD-10-CM

## 2021-02-04 MED ORDER — HYDROCORTISONE 1 % EX CREA: 1.0000 "application " | TOPICAL_CREAM | Freq: Two times a day (BID) | CUTANEOUS | 0 refills | Status: AC

## 2021-02-04 NOTE — Progress Notes (Signed)
  Subjective:    CC: eye rash  HPI: Pleasant 45 year old female presenting today with complaints of a rash that involves both eyes. Rash has been present for approximately 1 month on both upper eyelids, lower eyelids, and at the outer canthus. No drainage from any of the papules. Not itchy until today. Has been using oral and topical Benadryl. No new foods, medications, chemicals, cosmetics, or environmental exposures. Stopped using all of her facial products. Not taking a daily second generation antihistamine.   I reviewed the past medical history, family history, social history, surgical history, and allergies today and no changes were needed.  Please see the problem list section below in epic for further details.  Past Medical History: Past Medical History:  Diagnosis Date  . Hematuria    Past Surgical History: Past Surgical History:  Procedure Laterality Date  . APPENDECTOMY     Social History: Social History   Socioeconomic History  . Marital status: Married    Spouse name: Not on file  . Number of children: Not on file  . Years of education: Not on file  . Highest education level: Not on file  Occupational History  . Not on file  Tobacco Use  . Smoking status: Never Smoker  . Smokeless tobacco: Never Used  Vaping Use  . Vaping Use: Never used  Substance and Sexual Activity  . Alcohol use: Yes    Alcohol/week: 3.0 - 4.0 standard drinks    Types: 3 - 4 Glasses of wine per week  . Drug use: No  . Sexual activity: Yes    Birth control/protection: I.U.D.  Other Topics Concern  . Not on file  Social History Narrative  . Not on file   Social Determinants of Health   Financial Resource Strain: Not on file  Food Insecurity: Not on file  Transportation Needs: Not on file  Physical Activity: Not on file  Stress: Not on file  Social Connections: Not on file   Family History: Family History  Problem Relation Age of Onset  . Diabetes Father   . Hypertension Father    . Hyperlipidemia Father   . Heart disease Father   . Heart attack Father   . Cancer Other    Allergies: Allergies  Allergen Reactions  . Amoxicillin   . Cephalexin   . Clindamycin/Lincomycin   . Duricef [Cefadroxil]   . Zithromax [Azithromycin Dihydrate]    Medications: See med rec.  Review of Systems: See HPI for pertinent positives and negatives.   Objective:    General: Well Developed, well nourished, and in no acute distress.  Neuro: Alert and oriented x3.  HEENT: Normocephalic, atraumatic.  Skin: Warm and dry. Cardiac: Regular rate and rhythm, no murmurs rubs or gallops, no lower extremity edema.  Respiratory: Clear to auscultation bilaterally. Not using accessory muscles, speaking in full sentences.  Impression and Recommendations:    1. Eyelid dermatitis, allergic/contact Recommend using a daily oral antihistamine like Zyrtec or Claritin. Hydrocortisone 1% cream to affected area twice daily for up to 14 days. If no improvement, may need to consider a topical erythromycin course. Monitor for possible causes of rash.  Return if symptoms worsen or fail to improve. ___________________________________________ Thayer Ohm, DNP, APRN, FNP-BC Primary Care and Sports Medicine [facility] [place] [facility]

## 2021-02-09 DIAGNOSIS — L719 Rosacea, unspecified: Secondary | ICD-10-CM | POA: Diagnosis not present

## 2021-03-23 DIAGNOSIS — L719 Rosacea, unspecified: Secondary | ICD-10-CM | POA: Diagnosis not present

## 2022-10-12 DIAGNOSIS — Z1231 Encounter for screening mammogram for malignant neoplasm of breast: Secondary | ICD-10-CM | POA: Diagnosis not present

## 2022-10-12 DIAGNOSIS — Z01419 Encounter for gynecological examination (general) (routine) without abnormal findings: Secondary | ICD-10-CM | POA: Diagnosis not present

## 2022-10-12 DIAGNOSIS — Z6824 Body mass index (BMI) 24.0-24.9, adult: Secondary | ICD-10-CM | POA: Diagnosis not present

## 2022-11-04 ENCOUNTER — Encounter: Payer: Self-pay | Admitting: Medical-Surgical

## 2022-11-04 ENCOUNTER — Ambulatory Visit (INDEPENDENT_AMBULATORY_CARE_PROVIDER_SITE_OTHER): Payer: BC Managed Care – PPO | Admitting: Medical-Surgical

## 2022-11-04 VITALS — BP 117/81 | HR 77 | Ht 65.5 in | Wt 147.0 lb

## 2022-11-04 DIAGNOSIS — F419 Anxiety disorder, unspecified: Secondary | ICD-10-CM | POA: Diagnosis not present

## 2022-11-04 DIAGNOSIS — Z833 Family history of diabetes mellitus: Secondary | ICD-10-CM | POA: Diagnosis not present

## 2022-11-04 DIAGNOSIS — Z0000 Encounter for general adult medical examination without abnormal findings: Secondary | ICD-10-CM | POA: Diagnosis not present

## 2022-11-04 MED ORDER — HYDROXYZINE PAMOATE 25 MG PO CAPS: 25.0000 mg | ORAL_CAPSULE | Freq: Three times a day (TID) | ORAL | 0 refills | Status: DC | PRN

## 2022-11-04 MED ORDER — BUSPIRONE HCL 5 MG PO TABS: 5.0000 mg | ORAL_TABLET | Freq: Two times a day (BID) | ORAL | 1 refills | Status: DC | PRN

## 2022-11-04 NOTE — Progress Notes (Signed)
Complete physical exam  Patient: Paula Collins   DOB: 05/14/1976   46 y.o. Female  MRN: [id number]  Subjective:    Chief Complaint  Patient presents with   Annual Exam    IREENE Collins is a 46 y.o. female who presents today for a complete physical exam. She reports consuming a general diet.  Endorses some exercise.  She generally feels well. She reports sleeping well. She does have additional problems to discuss today.   Anxiety- reports that she has noticed a cyclical issue with anxiety that occurs about once every few months. The anxiety is very situational and is often accompanied by some depression and feelings of being overwhelmed. Has given lots of thought to treatment options and does not want to be on a medication she has to take every day. Would rather have something as needed. Admits that she took half of one of her relative's Xanax and it worked great for her. Also admits that she has a family history of addiction and this is something she worries about when discussing medications.    Most recent fall risk assessment:    09/11/2020    9:11 AM  Fall Risk   Falls in the past year? 0  Number falls in past yr: 0  Injury with Fall? 0  Follow up Falls evaluation completed     Most recent depression screenings:    11/04/2022    4:05 PM 09/11/2020    9:12 AM  PHQ 2/9 Scores  PHQ - 2 Score 1 0  PHQ- 9 Score  4    Vision:Within last year, Dental: No current dental problems and Receives regular dental care, and STD: The patient denies history of sexually transmitted disease.    Patient Care Team: Christen Butter, NP as PCP - General (Nurse Practitioner)   Outpatient Medications Prior to Visit  Medication Sig   hydrocortisone cream 1 % Apply 1 application topically 2 (two) times daily. (Patient taking differently: Apply 1 application  topically 2 (two) times daily as needed.)   levonorgestrel (MIRENA) 20 MCG/24HR IUD 1 each by Intrauterine route once.   Multiple Vitamin  tablet Take 1 tablet by mouth daily.   No facility-administered medications prior to visit.    Review of Systems  Constitutional:  Negative for chills, fever, malaise/fatigue and weight loss.  HENT:  Negative for congestion, ear pain, hearing loss, sinus pain and sore throat.   Eyes:  Negative for blurred vision, photophobia and pain.  Respiratory:  Negative for cough, shortness of breath and wheezing.   Cardiovascular:  Negative for chest pain, palpitations and leg swelling.  Gastrointestinal:  Negative for abdominal pain, constipation, diarrhea, heartburn, nausea and vomiting.  Genitourinary:  Negative for dysuria, frequency and urgency.  Musculoskeletal:  Negative for falls and neck pain.  Skin:  Negative for itching and rash.  Neurological:  Negative for dizziness, weakness and headaches.  Endo/Heme/Allergies:  Negative for polydipsia. Does not bruise/bleed easily.  Psychiatric/Behavioral:  Negative for depression, substance abuse and suicidal ideas. The patient is nervous/anxious. The patient does not have insomnia.      Objective:    BP 117/81   Pulse 77   Ht 5' 5.5" (1.664 m)   Wt 147 lb (66.7 kg)   SpO2 100%   BMI 24.09 kg/m    Physical Exam Constitutional:      General: She is not in acute distress.    Appearance: Normal appearance. She is normal weight. She is not ill-appearing.  HENT:  Head: Normocephalic and atraumatic.     Right Ear: Tympanic membrane, ear canal and external ear normal.     Left Ear: Tympanic membrane, ear canal and external ear normal.     Nose: Nose normal. No congestion or rhinorrhea.     Mouth/Throat:     Mouth: Mucous membranes are moist.     Pharynx: No oropharyngeal exudate or posterior oropharyngeal erythema.  Eyes:     General: No scleral icterus.       Right eye: No discharge.        Left eye: No discharge.     Extraocular Movements: Extraocular movements intact.     Conjunctiva/sclera: Conjunctivae normal.     Pupils: Pupils  are equal, round, and reactive to light.  Neck:     Thyroid: No thyromegaly.     Vascular: No carotid bruit or JVD.     Trachea: Trachea normal.  Cardiovascular:     Rate and Rhythm: Normal rate and regular rhythm.     Pulses: Normal pulses.     Heart sounds: Normal heart sounds. No murmur heard.    No friction rub. No gallop.  Pulmonary:     Effort: Pulmonary effort is normal. No respiratory distress.     Breath sounds: Normal breath sounds. No wheezing.  Abdominal:     General: Bowel sounds are normal. There is no distension.     Palpations: Abdomen is soft.     Tenderness: There is no abdominal tenderness. There is no guarding.  Musculoskeletal:        General: Normal range of motion.     Cervical back: Normal range of motion and neck supple.  Lymphadenopathy:     Cervical: No cervical adenopathy.  Skin:    General: Skin is warm and dry.  Neurological:     Mental Status: She is alert and oriented to person, place, and time.     Cranial Nerves: No cranial nerve deficit.  Psychiatric:        Mood and Affect: Mood normal.        Behavior: Behavior normal.        Thought Content: Thought content normal.        Judgment: Judgment normal.   No results found for any visits on 11/04/22.     Assessment & Plan:    Routine Health Maintenance and Physical Exam  Immunization History  Administered Date(s) Administered   Influenza,inj,Quad PF,6+ Mos 09/11/2020   Influenza-Unspecified 11/26/2014, 09/27/2019, 09/26/2020   PFIZER(Purple Top)SARS-COV-2 Vaccination 03/19/2020, 04/09/2020   Tdap 06/26/2009, 11/02/2010    Health Maintenance  Topic Date Due   COVID-19 Vaccine (3 - Pfizer series) 06/04/2020   TETANUS/TDAP  11/02/2020   INFLUENZA VACCINE  07/27/2022   PAP SMEAR-Modifier  10/25/2022   Hepatitis C Screening  09/11/2025 (Originally 09/07/1996)   HIV Screening  Completed   HPV VACCINES  Aged Out    Discussed health benefits of physical activity, and encouraged her to  engage in regular exercise appropriate for her age and condition.  1. Annual physical exam Checking labs as below. UTD on preventative care. Wellness information provided with AVS.  - Lipid panel - COMPLETE METABOLIC PANEL WITH GFR - CBC with Differential/Platelet  2. Family history of diabetes mellitus Checking hemoglobin A1c.  - Hemoglobin A1c  3. Anxiety Discussed options including counseling, daily medications, and prn medications. Reviewed the use of benzodiazepines and the risk of tolerance/dependence. Would prefer to stay away from that. Discussed BuSpar, beta-blockers, and Vistaril. She  likes the idea of something that will help her sleep at night but also worries about anxiety throughout the day when she can't afford to be sleepy. Sending in hydroxyzine 25-50mg  every 8 hours as needed but advised that this one may cause sedation so try to limit it to nighttime use. Also sending in BuSpar 5-15mg  twice daily to use on an as needed basis. Advised that the BuSpar is better when used regularly but should still have some effect on a prn basis. She will let me know if these medications are helpful or if we need to go back to review further options. We did talk about the use of daily medications to help prevent the anxiety rather than trying to stop it in it's tracks. She will consider this if prn medications do not work well for her.   Return in about 1 year (around 11/05/2023) for annual physical exam.   Christen Butter, NP

## 2022-11-05 DIAGNOSIS — Z833 Family history of diabetes mellitus: Secondary | ICD-10-CM | POA: Diagnosis not present

## 2022-11-05 DIAGNOSIS — Z0000 Encounter for general adult medical examination without abnormal findings: Secondary | ICD-10-CM | POA: Diagnosis not present

## 2022-11-05 LAB — COMPLETE METABOLIC PANEL WITH GFR
AG Ratio: 1.6 (calc) (ref 1.0–2.5)
ALT: 11 U/L (ref 6–29)
AST: 16 U/L (ref 10–30)
Albumin: 4.1 g/dL (ref 3.6–5.1)
Alkaline phosphatase (APISO): 50 U/L (ref 31–125)
BUN: 13 mg/dL (ref 7–25)
CO2: 24 mmol/L (ref 20–32)
Calcium: 9 mg/dL (ref 8.6–10.2)
Chloride: 105 mmol/L (ref 98–110)
Creat: 0.84 mg/dL (ref 0.50–0.99)
Globulin: 2.6 g/dL (calc) (ref 1.9–3.7)
Glucose, Bld: 93 mg/dL (ref 65–99)
Potassium: 4.2 mmol/L (ref 3.5–5.3)
Sodium: 137 mmol/L (ref 135–146)
Total Protein: 6.7 g/dL (ref 6.1–8.1)
eGFR: 88 mL/min/{1.73_m2} (ref >=60)

## 2022-11-05 LAB — LIPID PANEL
Cholesterol: 149 mg/dL (ref <200)
HDL: 63 mg/dL (ref >=50)
LDL Cholesterol (Calc): 69 mg/dL (calc)
Non-HDL Cholesterol (Calc): 86 mg/dL (calc) (ref <130)
Total CHOL/HDL Ratio: 2.4 (calc) (ref <5.0)
Triglycerides: 89 mg/dL (ref <150)

## 2022-11-06 LAB — HEMOGLOBIN A1C
Hgb A1c MFr Bld: 5.4 % of total Hgb (ref <5.7)
Mean Plasma Glucose: 108 mg/dL
eAG (mmol/L): 6 mmol/L

## 2022-11-06 LAB — CBC WITH DIFFERENTIAL/PLATELET
Absolute Monocytes: 488 cells/uL (ref 200–950)
Basophils Absolute: 42 cells/uL (ref 0–200)
Basophils Relative: 0.8 %
Eosinophils Absolute: 69 cells/uL (ref 15–500)
Eosinophils Relative: 1.3 %
HCT: 41.5 % (ref 35.0–45.0)
Hemoglobin: 14.1 g/dL (ref 11.7–15.5)
Lymphs Abs: 2088 cells/uL (ref 850–3900)
MCH: 30.5 pg (ref 27.0–33.0)
MCHC: 34 g/dL (ref 32.0–36.0)
MCV: 89.8 fL (ref 80.0–100.0)
MPV: 11.5 fL (ref 7.5–12.5)
Monocytes Relative: 9.2 %
Neutro Abs: 2613 cells/uL (ref 1500–7800)
Neutrophils Relative %: 49.3 %
Platelets: 221 10*3/uL (ref 140–400)
RBC: 4.62 10*6/uL (ref 3.80–5.10)
RDW: 12.3 % (ref 11.0–15.0)
Total Lymphocyte: 39.4 %
WBC: 5.3 10*3/uL (ref 3.8–10.8)

## 2022-11-06 LAB — COMPLETE METABOLIC PANEL WITH GFR: Total Bilirubin: 0.9 mg/dL (ref 0.2–1.2)

## 2022-11-10 DIAGNOSIS — L739 Follicular disorder, unspecified: Secondary | ICD-10-CM | POA: Diagnosis not present

## 2022-11-10 DIAGNOSIS — Z6824 Body mass index (BMI) 24.0-24.9, adult: Secondary | ICD-10-CM | POA: Diagnosis not present

## 2022-11-10 DIAGNOSIS — L0102 Bockhart's impetigo: Secondary | ICD-10-CM | POA: Diagnosis not present

## 2022-11-12 ENCOUNTER — Other Ambulatory Visit: Payer: Self-pay | Admitting: Medical-Surgical

## 2022-11-18 DIAGNOSIS — R519 Headache, unspecified: Secondary | ICD-10-CM | POA: Diagnosis not present

## 2022-11-18 DIAGNOSIS — H6121 Impacted cerumen, right ear: Secondary | ICD-10-CM | POA: Diagnosis not present

## 2022-11-27 ENCOUNTER — Other Ambulatory Visit: Payer: Self-pay | Admitting: Medical-Surgical

## 2023-01-24 ENCOUNTER — Ambulatory Visit: Payer: BC Managed Care – PPO | Admitting: Sports Medicine

## 2023-01-24 ENCOUNTER — Encounter: Payer: Self-pay | Admitting: Sports Medicine

## 2023-01-24 ENCOUNTER — Ambulatory Visit (INDEPENDENT_AMBULATORY_CARE_PROVIDER_SITE_OTHER): Payer: BC Managed Care – PPO

## 2023-01-24 DIAGNOSIS — R200 Anesthesia of skin: Secondary | ICD-10-CM

## 2023-01-24 DIAGNOSIS — R202 Paresthesia of skin: Secondary | ICD-10-CM

## 2023-01-24 DIAGNOSIS — M542 Cervicalgia: Secondary | ICD-10-CM

## 2023-01-24 MED ORDER — MELOXICAM 15 MG PO TABS: ORAL_TABLET | ORAL | 3 refills | Status: DC

## 2023-01-24 MED ORDER — PREDNISONE 50 MG PO TABS: ORAL_TABLET | ORAL | 0 refills | Status: DC

## 2023-01-24 NOTE — Assessment & Plan Note (Signed)
This is a very pleasant 47 year old female, she works at a bank, she has had over a month and a half of numbness and tingling right upper shoulder, upper arm to the hand, palm, into the second and third fingers. Her whole arm feels tired and heavy. On exam she has good strength, reflexes are fairly brisk throughout but she has a negative Hoffman's reflex bilaterally. I also noted a minimally positive Tinel's sign at the carpal tunnel. She had a negative Phalen sign. I do suspect this is more cervical radiculitis, we discussed ergonomics at work, I will add 5 days of prednisone, meloxicam afterwards, x-rays and home physical therapy, return to see me in approximately 6 weeks. We will do an MRI for interventional planning if not better. We did discuss the anatomy and evolutionary anthropology of lumbar and cervical disc disease.

## 2023-01-24 NOTE — Progress Notes (Signed)
    Procedures performed today:    None.  Independent interpretation of notes and tests performed by another provider:   None.  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 Her whole arm feels tired and heavy. On exam she has good strength, reflexes are fairly brisk throughout but she has a negative Hoffman's reflex bilaterally. I also noted a minimally positive Tinel's sign at the carpal tunnel. She had a negative Phalen sign. I do suspect this is more cervical radiculitis, we discussed ergonomics at work, I will add 5 days of prednisone, meloxicam afterwards, x-rays and home physical therapy, return to see me in approximately 6 weeks. We will do an MRI for interventional planning if not better. We did discuss the anatomy and evolutionary anthropology of lumbar and cervical disc disease.  Chronic process with exacerbation and pharmacologic intervention  ____________________________________________ Gwen Her. Dianah Field, M.D., ABFM., CAQSM., AME. Primary Care and Sports Medicine [facility]  Adjunct Professor of [employer] of Medicine  Risk manager

## 2023-03-07 ENCOUNTER — Ambulatory Visit: Payer: BC Managed Care – PPO | Admitting: Sports Medicine

## 2023-03-07 DIAGNOSIS — M238X9 Other internal derangements of unspecified knee: Secondary | ICD-10-CM | POA: Diagnosis not present

## 2023-03-07 DIAGNOSIS — R202 Paresthesia of skin: Secondary | ICD-10-CM

## 2023-03-07 DIAGNOSIS — R200 Anesthesia of skin: Secondary | ICD-10-CM

## 2023-03-07 NOTE — Assessment & Plan Note (Signed)
No pain so far but significant crepitus, we discussed some ergonomic changes, I also gave her patellofemoral conditioning, return to see me as needed for this.

## 2023-03-07 NOTE — Progress Notes (Signed)
    Procedures performed today:    None.  Independent interpretation of notes and tests performed by another provider:   None.  Brief History, Exam, Impression, and Recommendations:    Numbness and tingling of right arm Resolved with conservative treatment including an ergonomic evaluation at work. Return to see me as needed.  Patellofemoral crepitus No pain so far but significant crepitus, we discussed some ergonomic changes, I also gave her patellofemoral conditioning, return to see me as needed for this.    ____________________________________________ Gwen Her. Dianah Field, M.D., ABFM., CAQSM., AME. Primary Care and Sports Medicine [facility]  Adjunct Professor of [employer] of Medicine  Risk manager

## 2023-03-07 NOTE — Assessment & Plan Note (Signed)
Resolved with conservative treatment including an ergonomic evaluation at work. Return to see me as needed.

## 2023-03-29 ENCOUNTER — Telehealth (INDEPENDENT_AMBULATORY_CARE_PROVIDER_SITE_OTHER): Payer: BC Managed Care – PPO | Admitting: Medical-Surgical

## 2023-03-29 ENCOUNTER — Encounter: Payer: Self-pay | Admitting: Medical-Surgical

## 2023-03-29 DIAGNOSIS — N951 Menopausal and female climacteric states: Secondary | ICD-10-CM | POA: Diagnosis not present

## 2023-03-29 DIAGNOSIS — F419 Anxiety disorder, unspecified: Secondary | ICD-10-CM

## 2023-03-29 MED ORDER — VENLAFAXINE HCL ER 75 MG PO CP24: 75.0000 mg | ORAL_CAPSULE | Freq: Every day | ORAL | 1 refills | Status: DC

## 2023-03-29 MED ORDER — VENLAFAXINE HCL ER 37.5 MG PO CP24: ORAL_CAPSULE | ORAL | 0 refills | Status: DC

## 2023-03-29 NOTE — Progress Notes (Signed)
Virtual Visit via Video Note  I connected with Paula Collins on 03/29/23 at  1:00 PM EDT by a video enabled telemedicine application and verified that I am speaking with the correct person using two identifiers.   I discussed the limitations of evaluation and management by telemedicine and the availability of in person appointments. The patient expressed understanding and agreed to proceed.  Patient location: home Provider locations: office  Subjective:    CC: Mood follow-up  HPI: Very pleasant 47 year old female presenting via [place] video visit today to discuss concerns with increased anxiety.  Lately she has started to experience hot flashes that are waking her up several times nightly.  She is not sleeping well and has noticed it drastic increase in her anxiety and depressive symptoms.  This has been going on since November however over February and March, she noted a significant increase in her mood concerns.  Over the weekend, she was with her family and was very emotional.  Her sisters told her that she was no longer herself and she needed to reach out to her provider for recommendations.  She is open to options at this point.  Tried BuSpar which was a little helpful on an as-needed basis but this is more continuous.  Previously she had away from daily dosing of the medication but if it will help in this situation, she is all for it.  Denies SI/HI.   Past medical history, Surgical history, Family history not pertinant except as noted below, Social history, Allergies, and medications have been entered into the medical record, reviewed, and corrections made.   Review of Systems: See HPI for pertinent positives and negatives.   Objective:    General: Speaking clearly in complete sentences without any shortness of breath.  Alert and oriented x3.  Normal judgment. No apparent acute distress.  Impression and Recommendations:    1. Anxiety 2. Menopausal symptoms Since she has not had  an IUD in place for the last 15 years, she has not had any menstrual cycles to be able to determine if she is menopausal or not.  Discussed checking FSH/LH to evaluate her status however her symptoms are consistent with perimenopause.  Reviewed options for treatment.  Discussed Veozah for hot flashes however she is having the mood concerns so we will hold off on that for now.  Starting Effexor 37.5 mg daily x 1 week then increase to 75 mg daily.  Reviewed timeline and expectations for effectiveness.  I discussed the assessment and treatment plan with the patient. The patient was provided an opportunity to ask questions and all were answered. The patient agreed with the plan and demonstrated an understanding of the instructions.   The patient was advised to call back or seek an in-person evaluation if the symptoms worsen or if the condition fails to improve as anticipated.  25 minutes of non-face-to-face time was provided during this encounter.  Return in about 6 weeks (around 05/10/2023) for mood follow up.  Clearnce Sorrel, DNP, APRN, FNP-BC [facility]

## 2023-03-31 ENCOUNTER — Encounter: Payer: Self-pay | Admitting: Medical-Surgical

## 2023-03-31 MED ORDER — ONDANSETRON HCL 4 MG PO TABS: 4.0000 mg | ORAL_TABLET | Freq: Three times a day (TID) | ORAL | 0 refills | Status: DC | PRN

## 2023-05-03 ENCOUNTER — Encounter: Payer: Self-pay | Admitting: Medical-Surgical

## 2023-05-03 ENCOUNTER — Ambulatory Visit (INDEPENDENT_AMBULATORY_CARE_PROVIDER_SITE_OTHER): Payer: BC Managed Care – PPO | Admitting: Medical-Surgical

## 2023-05-03 VITALS — BP 105/69 | HR 79 | Resp 20 | Ht 65.5 in | Wt 147.7 lb

## 2023-05-03 DIAGNOSIS — F419 Anxiety disorder, unspecified: Secondary | ICD-10-CM | POA: Diagnosis not present

## 2023-05-03 DIAGNOSIS — N951 Menopausal and female climacteric states: Secondary | ICD-10-CM

## 2023-05-03 MED ORDER — DESVENLAFAXINE SUCCINATE ER 50 MG PO TB24: 50.0000 mg | ORAL_TABLET | Freq: Every day | ORAL | 1 refills | Status: DC

## 2023-05-03 NOTE — Progress Notes (Signed)
        Established patient visit  History, exam, impression, and plan:  1. Anxiety 2. Menopausal symptoms Pleasant 47 year old female following up on anxiety, depression, and menopausal symptoms.  She has been taking Effexor as prescribed and is currently doing fairly well on 75 mg daily.  Notes that it had caused significant nausea in the first few days which has somewhat improved but has not fully resolved. Trying to change the timing of her dose to minimize the nausea but hasn't found the best time yet. Still having brief periods of nausea throughout the day but feels that it is manageable.  Despite her ongoing nausea, she feels that the medication has worked extremely well.  Feels that her anxiety is almost completely gone and most of her depression symptoms have resolved.  Her night sweats and hot flashes are much better and she is able to get some rest at night.  Overall happy with the medication but interested in options that may help with the nausea.  Normal mood, thought pattern, cognition, and affect.  Denies SI/HI.  Discussed options.  We will try switching over to Pristiq to see if this is better tolerated.  Start Pristiq 50 mg daily.  Discussed stopping Effexor and switching to Pristiq the next day as no need to taper.  She will reach out to me and let me know if she has any issues with tolerance or response to the medication.  Procedures performed this visit: None.  Return if symptoms worsen or fail to improve.  __________________________________ Thayer Ohm, DNP, APRN, FNP-BC [facility]

## 2023-05-10 ENCOUNTER — Encounter: Payer: Self-pay | Admitting: Medical-Surgical

## 2023-05-18 ENCOUNTER — Other Ambulatory Visit: Payer: Self-pay | Admitting: Sports Medicine

## 2023-05-18 DIAGNOSIS — R200 Anesthesia of skin: Secondary | ICD-10-CM

## 2023-05-25 ENCOUNTER — Other Ambulatory Visit: Payer: Self-pay | Admitting: Medical-Surgical

## 2023-05-31 ENCOUNTER — Encounter: Payer: Self-pay | Admitting: Medical-Surgical

## 2023-05-31 MED ORDER — VENLAFAXINE HCL ER 37.5 MG PO CP24: ORAL_CAPSULE | ORAL | 0 refills | Status: DC

## 2023-08-08 ENCOUNTER — Other Ambulatory Visit: Payer: Self-pay | Admitting: Medical-Surgical

## 2023-08-10 ENCOUNTER — Other Ambulatory Visit: Payer: Self-pay | Admitting: Medical-Surgical

## 2023-08-10 ENCOUNTER — Encounter: Payer: Self-pay | Admitting: Medical-Surgical

## 2023-08-21 ENCOUNTER — Other Ambulatory Visit: Payer: Self-pay | Admitting: Medical-Surgical

## 2023-10-03 ENCOUNTER — Encounter: Payer: Self-pay | Admitting: Medical-Surgical

## 2023-10-03 ENCOUNTER — Ambulatory Visit (INDEPENDENT_AMBULATORY_CARE_PROVIDER_SITE_OTHER): Payer: BC Managed Care – PPO | Admitting: Medical-Surgical

## 2023-10-03 VITALS — BP 113/80 | HR 92 | Resp 20 | Ht 65.5 in | Wt 152.3 lb

## 2023-10-03 DIAGNOSIS — Z1211 Encounter for screening for malignant neoplasm of colon: Secondary | ICD-10-CM | POA: Diagnosis not present

## 2023-10-03 DIAGNOSIS — Z23 Encounter for immunization: Secondary | ICD-10-CM

## 2023-10-03 DIAGNOSIS — N951 Menopausal and female climacteric states: Secondary | ICD-10-CM | POA: Diagnosis not present

## 2023-10-03 DIAGNOSIS — Z0000 Encounter for general adult medical examination without abnormal findings: Secondary | ICD-10-CM | POA: Diagnosis not present

## 2023-10-03 DIAGNOSIS — R5383 Other fatigue: Secondary | ICD-10-CM

## 2023-10-03 MED ORDER — ONDANSETRON HCL 4 MG PO TABS: 4.0000 mg | ORAL_TABLET | Freq: Three times a day (TID) | ORAL | 0 refills | Status: AC | PRN

## 2023-10-03 NOTE — Addendum Note (Signed)
Addended by: Latanya Presser on: 10/03/2023 09:19 AM   Modules accepted: Orders

## 2023-10-03 NOTE — Patient Instructions (Signed)
Preventive Care 40-45 Years Old, Female Preventive care refers to lifestyle choices and visits with your health care provider that can promote health and wellness. Preventive care visits are also called wellness exams. What can I expect for my preventive care visit? Counseling Your health care provider may ask you questions about your: Medical history, including: Past medical problems. Family medical history. Pregnancy history. Current health, including: Menstrual cycle. Method of birth control. Emotional well-being. Home life and relationship well-being. Sexual activity and sexual health. Lifestyle, including: Alcohol, nicotine or tobacco, and drug use. Access to firearms. Diet, exercise, and sleep habits. Work and work environment. Sunscreen use. Safety issues such as seatbelt and bike helmet use. Physical exam Your health care provider will check your: Height and weight. These may be used to calculate your BMI (body mass index). BMI is a measurement that tells if you are at a healthy weight. Waist circumference. This measures the distance around your waistline. This measurement also tells if you are at a healthy weight and may help predict your risk of certain diseases, such as type 2 diabetes and high blood pressure. Heart rate and blood pressure. Body temperature. Skin for abnormal spots. What immunizations do I need?  Vaccines are usually given at various ages, according to a schedule. Your health care provider will recommend vaccines for you based on your age, medical history, and lifestyle or other factors, such as travel or where you work. What tests do I need? Screening Your health care provider may recommend screening tests for certain conditions. This may include: Lipid and cholesterol levels. Diabetes screening. This is done by checking your blood sugar (glucose) after you have not eaten for a while (fasting). Pelvic exam and Pap test. Hepatitis B test. Hepatitis C  test. HIV (human immunodeficiency virus) test. STI (sexually transmitted infection) testing, if you are at risk. Lung cancer screening. Colorectal cancer screening. Mammogram. Talk with your health care provider about when you should start having regular mammograms. This may depend on whether you have a family history of breast cancer. BRCA-related cancer screening. This may be done if you have a family history of breast, ovarian, tubal, or peritoneal cancers. Bone density scan. This is done to screen for osteoporosis. Talk with your health care provider about your test results, treatment options, and if necessary, the need for more tests. Follow these instructions at home: Eating and drinking  Eat a diet that includes fresh fruits and vegetables, whole grains, lean protein, and low-fat dairy products. Take vitamin and mineral supplements as recommended by your health care provider. Do not drink alcohol if: Your health care provider tells you not to drink. You are pregnant, may be pregnant, or are planning to become pregnant. If you drink alcohol: Limit how much you have to 0-1 drink a day. Know how much alcohol is in your drink. In the [place] one drink equals one 12 oz bottle of beer (355 mL), one 5 oz glass of wine (148 mL), or one 1 oz glass of hard liquor (44 mL). Lifestyle Brush your teeth every morning and night with fluoride toothpaste. Floss one time each day. Exercise for at least 30 minutes 5 or more days each week. Do not use any products that contain nicotine or tobacco. These products include cigarettes, chewing tobacco, and vaping devices, such as e-cigarettes. If you need help quitting, ask your health care provider. Do not use drugs. If you are sexually active, practice safe sex. Use a condom or other form of protection to   prevent STIs. If you do not wish to become pregnant, use a form of birth control. If you plan to become pregnant, see your health care provider for a  prepregnancy visit. Take aspirin only as told by your health care provider. Make sure that you understand how much to take and what form to take. Work with your health care provider to find out whether it is safe and beneficial for you to take aspirin daily. Find healthy ways to manage stress, such as: Meditation, yoga, or listening to music. Journaling. Talking to a trusted person. Spending time with friends and family. Minimize exposure to UV radiation to reduce your risk of skin cancer. Safety Always wear your seat belt while driving or riding in a vehicle. Do not drive: If you have been drinking alcohol. Do not ride with someone who has been drinking. When you are tired or distracted. While texting. If you have been using any mind-altering substances or drugs. Wear a helmet and other protective equipment during sports activities. If you have firearms in your house, make sure you follow all gun safety procedures. Seek help if you have been physically or sexually abused. What's next? Visit your health care provider once a year for an annual wellness visit. Ask your health care provider how often you should have your eyes and teeth checked. Stay up to date on all vaccines. This information is not intended to replace advice given to you by your health care provider. Make sure you discuss any questions you have with your health care provider. Document Revised: 06/10/2021 Document Reviewed: 06/10/2021 Elsevier Patient Education  2024 Elsevier Inc.  

## 2023-10-03 NOTE — Progress Notes (Signed)
Complete physical exam  Patient: Paula Collins   DOB: Jul 10, 1976   47 y.o. Female  MRN: [id number]  Subjective:    Chief Complaint  Patient presents with   Annual Exam   Paula Collins is a 47 y.o. female who presents today for a complete physical exam. She reports consuming a general diet.  Walking for exercise.  She generally feels well. She reports sleeping fairly well. She does not have additional problems to discuss today.    Most recent fall risk assessment:    05/03/2023    8:54 AM  Fall Risk   Falls in the past year? 0  Number falls in past yr: 0  Injury with Fall? 0  Risk for fall due to : No Fall Risks  Follow up Falls evaluation completed     Most recent depression screenings:    05/03/2023    8:55 AM 11/04/2022    4:05 PM  PHQ 2/9 Scores  PHQ - 2 Score 0 1    Vision:Within last year, Dental: No current dental problems and Receives regular dental care, and STD: The patient denies history of sexually transmitted disease.    Patient Care Team: Christen Butter, NP as PCP - General (Nurse Practitioner)   Outpatient Medications Prior to Visit  Medication Sig   hydrocortisone cream 1 % Apply 1 application topically 2 (two) times daily. (Patient taking differently: Apply 1 application  topically 2 (two) times daily as needed.)   hydrOXYzine (VISTARIL) 25 MG capsule Take 1-2 capsules (25-50 mg total) by mouth every 8 (eight) hours as needed.   levonorgestrel (MIRENA) 20 MCG/24HR IUD 1 each by Intrauterine route once.   meloxicam (MOBIC) 15 MG tablet ONE TAB BY MOUTH EVERY 24 HOURS WITH A MEAL FOR 2 WEEKS, THEN ONCE EVERY 24 HOURS AS NEEDED 4/PAIN.   Multiple Vitamin tablet Take 1 tablet by mouth daily.   venlafaxine XR (EFFEXOR-XR) 37.5 MG 24 hr capsule TAKE 37.5 MG IN ADDITION TO 75 MG TO EQUAL A TOTAL OF 112.5 MG DAILY.   [DISCONTINUED] ondansetron (ZOFRAN) 4 MG tablet Take 1 tablet (4 mg total) by mouth every 8 (eight) hours as needed for nausea or vomiting.   No  facility-administered medications prior to visit.    Review of Systems  Constitutional:  Negative for chills, fever, malaise/fatigue and weight loss.  HENT:  Negative for congestion, ear pain, hearing loss, sinus pain and sore throat.   Eyes:  Negative for blurred vision, photophobia and pain.  Respiratory:  Negative for cough, shortness of breath and wheezing.   Cardiovascular:  Negative for chest pain, palpitations and leg swelling.  Gastrointestinal:  Positive for nausea. Negative for abdominal pain, constipation, diarrhea, heartburn and vomiting.  Genitourinary:  Negative for dysuria, frequency and urgency.  Musculoskeletal:  Negative for falls and neck pain.  Skin:  Negative for itching and rash.  Neurological:  Negative for dizziness, weakness and headaches.  Endo/Heme/Allergies:  Negative for polydipsia. Does not bruise/bleed easily.  Psychiatric/Behavioral:  Negative for depression, substance abuse and suicidal ideas. The patient is not nervous/anxious.      Objective:    BP 113/80 (BP Location: Left Arm, Cuff Size: Normal)   Pulse 92   Resp 20   Ht 5' 5.5" (1.664 m)   Wt 152 lb 4.8 oz (69.1 kg)   SpO2 99%   BMI 24.96 kg/m    Physical Exam Vitals reviewed.  Constitutional:      General: She is not in acute distress.  Appearance: Normal appearance. She is normal weight. She is not ill-appearing.  HENT:     Head: Normocephalic and atraumatic.     Right Ear: Tympanic membrane, ear canal and external ear normal. There is no impacted cerumen.     Left Ear: Tympanic membrane, ear canal and external ear normal. There is no impacted cerumen.     Nose: Nose normal. No congestion or rhinorrhea.     Mouth/Throat:     Mouth: Mucous membranes are moist.     Pharynx: No oropharyngeal exudate or posterior oropharyngeal erythema.  Eyes:     General: No scleral icterus.       Right eye: No discharge.        Left eye: No discharge.     Extraocular Movements: Extraocular  movements intact.     Conjunctiva/sclera: Conjunctivae normal.     Pupils: Pupils are equal, round, and reactive to light.  Neck:     Thyroid: No thyromegaly.     Vascular: No carotid bruit or JVD.     Trachea: Trachea normal.  Cardiovascular:     Rate and Rhythm: Normal rate and regular rhythm.     Pulses: Normal pulses.     Heart sounds: Normal heart sounds. No murmur heard.    No friction rub. No gallop.  Pulmonary:     Effort: Pulmonary effort is normal. No respiratory distress.     Breath sounds: Normal breath sounds. No wheezing.  Abdominal:     General: Bowel sounds are normal. There is no distension.     Palpations: Abdomen is soft.     Tenderness: There is no abdominal tenderness. There is no guarding.  Musculoskeletal:        General: Normal range of motion.     Cervical back: Normal range of motion and neck supple.  Lymphadenopathy:     Cervical: No cervical adenopathy.  Skin:    General: Skin is warm and dry.  Neurological:     Mental Status: She is alert and oriented to person, place, and time.     Cranial Nerves: No cranial nerve deficit.  Psychiatric:        Mood and Affect: Mood normal.        Behavior: Behavior normal.        Thought Content: Thought content normal.        Judgment: Judgment normal.      No results found for any visits on 10/03/23.     Assessment & Plan:    Routine Health Maintenance and Physical Exam  Immunization History  Administered Date(s) Administered   Influenza,inj,Quad PF,6+ Mos 09/11/2020   Influenza-Unspecified 11/26/2014, 09/27/2019, 09/26/2020   PFIZER(Purple Top)SARS-COV-2 Vaccination 03/19/2020, 04/09/2020   Tdap 06/26/2009, 11/02/2010    Health Maintenance  Topic Date Due   DTaP/Tdap/Td (3 - Td or Tdap) 11/02/2020   Colonoscopy  Never done   COVID-19 Vaccine (3 - 2023-24 season) 10/19/2023 (Originally 08/28/2023)   INFLUENZA VACCINE  03/26/2024 (Originally 07/28/2023)   Hepatitis C Screening  09/11/2025  (Originally 09/07/1996)   HIV Screening  09/11/2025 (Originally 09/07/1993)   Cervical Cancer Screening (HPV/Pap Cotest)  10/25/2024   HPV VACCINES  Aged Out    Discussed health benefits of physical activity, and encouraged her to engage in regular exercise appropriate for her age and condition.  1. Annual physical exam Checking labs today.  Up-to-date on manage of care.  Wellness information provided with AVS. - CBC with Differential/Platelet - CMP14+EGFR  2. Colon cancer screening Discussed  colon cancer recommendations for screening at the age of 79.  She would like to do Cologuard.  Low risk for colon cancer and no strong family history of any issues.  Cologuard ordered today. - Cologuard  3. Fatigue, unspecified type Continues to have issues with significant fatigue and is wondering about cortisol levels.  We have never checked these so adding this to blood work today.  Discussed the role of cortisol and the wild fluctuations that can happen depending on time of day, sleep quality, and stress levels. - Cortisol  4. Menopausal symptoms Intolerant of Effexor at the 112.5 mg dose.  Has dialed back to 75 mg daily however this is also causing nausea on a daily basis.  Has had some vomiting.  Plan to decrease to 37.5 mg daily for the next 3-4 weeks to evaluate tolerance at this dose and monitor for efficacy.  If this is not helpful, plan to do a very slow taper off the medication and start Veozah for vasomotor symptoms.  Also discussed Cymbalta but we will hold off on this for now.  Return in about 1 year (around 10/02/2024) for annual physical exam.     Christen Butter, NP

## 2023-10-04 LAB — CBC WITH DIFFERENTIAL/PLATELET
Basophils Absolute: 0 10*3/uL (ref 0.0–0.2)
Basos: 1 %
EOS (ABSOLUTE): 0.1 10*3/uL (ref 0.0–0.4)
Eos: 2 %
Hematocrit: 46 % (ref 34.0–46.6)
Hemoglobin: 15.1 g/dL (ref 11.1–15.9)
Immature Grans (Abs): 0 10*3/uL (ref 0.0–0.1)
Immature Granulocytes: 0 %
Lymphocytes Absolute: 1.8 10*3/uL (ref 0.7–3.1)
Lymphs: 32 %
MCH: 30.2 pg (ref 26.6–33.0)
MCHC: 32.8 g/dL (ref 31.5–35.7)
MCV: 92 fL (ref 79–97)
Monocytes Absolute: 0.4 10*3/uL (ref 0.1–0.9)
Monocytes: 8 %
Neutrophils Absolute: 3.2 10*3/uL (ref 1.4–7.0)
Neutrophils: 57 %
Platelets: 225 10*3/uL (ref 150–450)
RBC: 5 x10E6/uL (ref 3.77–5.28)
RDW: 13 % (ref 11.7–15.4)
WBC: 5.5 10*3/uL (ref 3.4–10.8)

## 2023-10-04 LAB — CMP14+EGFR
ALT: 16 [IU]/L (ref 0–32)
AST: 25 [IU]/L (ref 0–40)
Albumin: 4.5 g/dL (ref 3.9–4.9)
Alkaline Phosphatase: 75 [IU]/L (ref 44–121)
BUN/Creatinine Ratio: 14 (ref 9–23)
BUN: 12 mg/dL (ref 6–24)
Bilirubin Total: 0.9 mg/dL (ref 0.0–1.2)
CO2: 21 mmol/L (ref 20–29)
Calcium: 9 mg/dL (ref 8.7–10.2)
Chloride: 102 mmol/L (ref 96–106)
Creatinine, Ser: 0.87 mg/dL (ref 0.57–1.00)
Globulin, Total: 2.6 g/dL (ref 1.5–4.5)
Glucose: 90 mg/dL (ref 70–99)
Potassium: 4.3 mmol/L (ref 3.5–5.2)
Sodium: 138 mmol/L (ref 134–144)
Total Protein: 7.1 g/dL (ref 6.0–8.5)
eGFR: 84 mL/min/{1.73_m2} (ref >59)

## 2023-10-04 LAB — CORTISOL: Cortisol: 13.4 ug/dL (ref 6.2–19.4)

## 2023-10-28 ENCOUNTER — Encounter: Payer: Self-pay | Admitting: Medical-Surgical

## 2023-11-04 ENCOUNTER — Other Ambulatory Visit: Payer: Self-pay | Admitting: Medical-Surgical

## 2023-11-18 ENCOUNTER — Other Ambulatory Visit: Payer: Self-pay | Admitting: Medical-Surgical

## 2023-11-21 ENCOUNTER — Encounter: Payer: Self-pay | Admitting: Medical-Surgical

## 2023-11-29 ENCOUNTER — Ambulatory Visit: Payer: BC Managed Care – PPO | Admitting: Medical-Surgical

## 2023-11-29 VITALS — BP 103/68 | HR 88 | Resp 20 | Ht 65.5 in | Wt 154.7 lb

## 2023-11-29 DIAGNOSIS — Z1231 Encounter for screening mammogram for malignant neoplasm of breast: Secondary | ICD-10-CM | POA: Diagnosis not present

## 2023-11-29 DIAGNOSIS — F419 Anxiety disorder, unspecified: Secondary | ICD-10-CM | POA: Diagnosis not present

## 2023-11-29 DIAGNOSIS — Z124 Encounter for screening for malignant neoplasm of cervix: Secondary | ICD-10-CM | POA: Diagnosis not present

## 2023-11-29 DIAGNOSIS — Z01419 Encounter for gynecological examination (general) (routine) without abnormal findings: Secondary | ICD-10-CM | POA: Diagnosis not present

## 2023-11-29 MED ORDER — VENLAFAXINE HCL ER 37.5 MG PO CP24: 37.5000 mg | ORAL_CAPSULE | Freq: Every day | ORAL | 1 refills | Status: DC

## 2023-11-29 MED ORDER — HYDROXYZINE PAMOATE 25 MG PO CAPS: 25.0000 mg | ORAL_CAPSULE | Freq: Three times a day (TID) | ORAL | 0 refills | Status: AC | PRN

## 2023-11-29 NOTE — Progress Notes (Signed)
        Established patient visit  History, exam, impression, and plan:  1. Anxiety Very pleasant 47 year old female presenting today to report concerns with significantly increased anxiety over the last couple of months.  Has had a lot of family issues and stressors that started in 22-Sep-2023.  She has been managing with use of Effexor 37.5-75 mg daily depending on her tolerance of the medication and side effects.  Uses hydroxyzine at night to help with sleep.  Reports that her husband's mother passed away in 09-22-2023.  In addition to the holidays, they found out that his father is in [place] and undergoing treatment for stage IV prostate cancer.  On the way to [place], her husband had an accident and broke his foot.  Reports that she is feeling very overwhelmed and dealing with anxiety every day that is no longer manageable.  Is not interested in counseling due to time constraints and does not want to change her medications at this time.  She is requesting work accommodations to allow her to work from home as her job is already considered a hybrid position.  Feels that working from home would help relieve some of the stress until they can get through this tough time.  Feel that is reasonable to allow her to work from home so advised her to send me the forms and I will be glad to get them filled out.   Procedures performed this visit: None.  Return if symptoms worsen or fail to improve.  __________________________________ Thayer Ohm, DNP, APRN, FNP-BC Primary Care and Sports Medicine [facility]

## 2023-12-01 ENCOUNTER — Encounter: Payer: Self-pay | Admitting: Medical-Surgical

## 2023-12-13 ENCOUNTER — Other Ambulatory Visit: Payer: Self-pay | Admitting: Medical-Surgical

## 2024-01-10 DIAGNOSIS — Z30430 Encounter for insertion of intrauterine contraceptive device: Secondary | ICD-10-CM | POA: Diagnosis not present

## 2024-01-10 DIAGNOSIS — Z30433 Encounter for removal and reinsertion of intrauterine contraceptive device: Secondary | ICD-10-CM | POA: Diagnosis not present

## 2024-02-13 ENCOUNTER — Other Ambulatory Visit: Payer: Self-pay | Admitting: Medical-Surgical

## 2024-02-18 ENCOUNTER — Other Ambulatory Visit: Payer: Self-pay | Admitting: Medical-Surgical

## 2024-04-15 DIAGNOSIS — H66001 Acute suppurative otitis media without spontaneous rupture of ear drum, right ear: Secondary | ICD-10-CM | POA: Diagnosis not present

## 2024-04-15 DIAGNOSIS — Z6824 Body mass index (BMI) 24.0-24.9, adult: Secondary | ICD-10-CM | POA: Diagnosis not present

## 2024-06-20 ENCOUNTER — Ambulatory Visit

## 2024-06-20 DIAGNOSIS — M542 Cervicalgia: Secondary | ICD-10-CM | POA: Diagnosis not present

## 2024-06-21 ENCOUNTER — Ambulatory Visit: Admitting: Medical-Surgical

## 2024-06-28 ENCOUNTER — Encounter: Payer: Self-pay | Admitting: Medical-Surgical

## 2024-06-28 ENCOUNTER — Ambulatory Visit: Admitting: Medical-Surgical

## 2024-06-28 VITALS — BP 134/91 | HR 98 | Resp 20 | Ht 65.5 in | Wt 156.0 lb

## 2024-06-28 DIAGNOSIS — H7092 Unspecified mastoiditis, left ear: Secondary | ICD-10-CM | POA: Diagnosis not present

## 2024-06-28 DIAGNOSIS — R002 Palpitations: Secondary | ICD-10-CM | POA: Diagnosis not present

## 2024-06-28 MED ORDER — DOXYCYCLINE HYCLATE 100 MG PO TABS: 100.0000 mg | ORAL_TABLET | Freq: Two times a day (BID) | ORAL | 0 refills | Status: AC

## 2024-06-28 MED ORDER — HYDROCODONE-ACETAMINOPHEN 5-325 MG PO TABS: 1.0000 | ORAL_TABLET | Freq: Three times a day (TID) | ORAL | 0 refills | Status: AC | PRN

## 2024-06-28 NOTE — Progress Notes (Signed)
 Established patient visit  History, exam, impression, and plan:  1. Mastoiditis of left side (Primary) Pleasant 48 year old female presents today with pain on the left side of the neck that started around 6/21 while she was at a wedding.  She had no injury or fall but reports that the onset was sudden.  It was mild at the beginning but quickly worsened until she rated at 8/10.  She was seen in urgent care on 6/25 where they prescribed her a burst of prednisone  40 mg daily and tizanidine as needed.  She completed the prednisone  as instructed and noted some mild improvement in her symptoms however once it was completed, her symptoms began to worsen again.  Tizanidine helps her sleep when taken at night however she only is able to sleep for a few hours before she is waking in pain.  Has been taking maximum dose Tylenol and ibuprofen without benefit.  On exam she has exquisite tenderness in the area marked on the diagram.  Mild left tonsillar enlargement.  Left ear canal blocked by cerumen and unable to visualize left TM.  Offered irrigation but patient declined today due to pain level.  They have an irrigation kit at home she and her husband will clear her ear once she gets to feeling better.  Symptoms consistent with mastoiditis.  Due to allergies to penicillins and cephalosporins, starting doxycycline 100 mg twice daily x 7 days.  Adding Norco for pain.  Okay to continue Tylenol/ibuprofen but make sure not to exceed daily recommended dosing for each.  Okay to use heat/ice if desired.  2. Palpitations Over the past week has noted having some heart flutters that are not accompanied by any other symptoms.  Not sure if this is related to her current pain, anxiety related to the unknown of her diagnosis, or a cardiac issue.  Family history of cardiac disease.  In office EKG completed today with a rate of 84, normal axis, normal sinus rhythm.  Machine read noted possible left atrial enlargement however on  review, nothing concerning at this time.  Would like to treat the mastoiditis as noted above and if palpitations continue, we can certainly look at doing blood work, echocardiogram, and long-term monitoring.  Patient would like to hold off on further investigation for now but will let me know if she would like to proceed in the near future. - EKG 12-Lead  Review of Systems  HENT:  Positive for ear pain (left).   Cardiovascular:  Positive for palpitations.  Musculoskeletal:  Positive for neck pain (Left side behind the ear).  Neurological:  Positive for headaches (head pressure).   Physical Exam Vitals reviewed.  Constitutional:      General: She is not in acute distress.    Appearance: Normal appearance.  HENT:     Head: Normocephalic and atraumatic.      Comments: Area of tenderness noted above    Left Ear: Hearing normal. Tenderness present. There is impacted cerumen. There is mastoid tenderness.     Mouth/Throat:     Lips: Pink.     Mouth: Mucous membranes are moist.     Tonsils: 1+ on the right. 2+ on the left.  Cardiovascular:     Rate and Rhythm: Normal rate and regular rhythm.     Pulses: Normal pulses.     Heart sounds: Normal heart sounds. No murmur heard.    No friction rub. No gallop.  Pulmonary:     Effort:  Pulmonary effort is normal. No respiratory distress.     Breath sounds: Normal breath sounds. No wheezing.  Skin:    General: Skin is warm and dry.  Neurological:     Mental Status: She is alert and oriented to person, place, and time.  Psychiatric:        Mood and Affect: Mood normal.        Behavior: Behavior normal.        Thought Content: Thought content normal.        Judgment: Judgment normal.   Procedures performed this visit: None.  Return if symptoms worsen or fail to improve.  __________________________________ Zada FREDRIK Palin, DNP, APRN, FNP-BC Primary Care and Sports Medicine [facility]

## 2024-06-30 ENCOUNTER — Encounter: Payer: Self-pay | Admitting: Medical-Surgical

## 2024-07-01 ENCOUNTER — Ambulatory Visit
Admission: RE | Admit: 2024-07-01 | Discharge: 2024-07-01 | Disposition: A | Discharge status: Other Acute Inpatient Hospital | Source: Ambulatory Visit | Attending: Family Medicine | Admitting: Family Medicine

## 2024-07-01 VITALS — BP 145/93 | HR 132 | Temp 98.3°F | Resp 18 | Ht 66.0 in | Wt 156.0 lb

## 2024-07-01 DIAGNOSIS — H70001 Acute mastoiditis without complications, right ear: Secondary | ICD-10-CM | POA: Diagnosis not present

## 2024-07-01 DIAGNOSIS — R519 Headache, unspecified: Secondary | ICD-10-CM | POA: Diagnosis not present

## 2024-07-01 DIAGNOSIS — H70002 Acute mastoiditis without complications, left ear: Secondary | ICD-10-CM

## 2024-07-01 DIAGNOSIS — N644 Mastodynia: Secondary | ICD-10-CM | POA: Diagnosis not present

## 2024-07-01 DIAGNOSIS — M542 Cervicalgia: Secondary | ICD-10-CM | POA: Diagnosis not present

## 2024-07-01 DIAGNOSIS — R002 Palpitations: Secondary | ICD-10-CM | POA: Diagnosis not present

## 2024-07-01 DIAGNOSIS — H9202 Otalgia, left ear: Secondary | ICD-10-CM | POA: Diagnosis not present

## 2024-07-01 NOTE — ED Notes (Signed)
 Patient discharged home on stable condition with AVS provided; left with all of her belongings   Paula Collins, [place] 07/01/24 1324

## 2024-07-01 NOTE — ED Triage Notes (Signed)
 Per UC provider note Patient was seen by her PCP a couple of days ago and diagnosed with acute mastoiditis. She was started on doxycycline  because of multiple drug allergies. She was given hydrocodone  for pain. Patient is worsening over time. She has severe pain in the left neck. It extending over towards the right side. Her neck is very stiff and she can hardly move her head. She cannot sleep. Hearing is normal. No dizziness. She is having nausea but she thinks it is from the pain. No sweats chills fever Patient states she is having runs of rapid heartbeat. They feel like palpitations. She had an EKG at her PCPs office which was negative. No history of heart problems.

## 2024-07-01 NOTE — Discharge Instructions (Signed)
 Please go directly to [facility] for a higher level of care

## 2024-07-01 NOTE — ED Notes (Signed)
 [facility] triage given report

## 2024-07-01 NOTE — ED Provider Notes (Signed)
 [facility]    CSN: [id number] Arrival date & time: 07/01/24  0857      History   Chief Complaint Chief Complaint  Patient presents with   Otalgia    HPI Paula Collins is a 48 y.o. female.   HPI  Patient was seen by her PCP a couple of days ago and diagnosed with acute mastoiditis.  She was started on doxycycline  because of multiple drug allergies.  She was given hydrocodone  for pain.  Patient is worsening over time.  She has severe pain in the left neck.  It extending over towards the right side.  Her neck is very stiff and she can hardly move her head.  She cannot sleep.  Hearing is normal.  No dizziness.  She is having nausea but she thinks it is from the pain.  No sweats chills fever Patient states she is having runs of rapid heartbeat.  They feel like palpitations.  She had an EKG at her PCPs office which was negative.  No history of heart problems.  Past Medical History:  Diagnosis Date   Hematuria     Patient Active Problem List   Diagnosis Date Noted   Patellofemoral crepitus 03/07/2023   Numbness and tingling of right arm 01/24/2023   Difficulty sleeping 09/11/2020   Nystagmus 09/11/2020   Hematuria 09/11/2020    Past Surgical History:  Procedure Laterality Date   APPENDECTOMY      OB History   No obstetric history on file.      Home Medications    Prior to Admission medications   Medication Sig Start Date End Date Taking? Authorizing Provider  doxycycline  (VIBRA -TABS) 100 MG tablet Take 1 tablet (100 mg total) by mouth 2 (two) times daily. 06/28/24  Yes Willo Mini, NP  HYDROcodone -acetaminophen  (NORCO/VICODIN) 5-325 MG tablet Take 1 tablet by mouth every 8 (eight) hours as needed for moderate pain (pain score 4-6). 06/28/24  Yes Willo Mini, NP  hydrOXYzine  (VISTARIL ) 25 MG capsule Take 1-2 capsules (25-50 mg total) by mouth every 8 (eight) hours as needed. 11/29/23  Yes Willo Mini, NP  levonorgestrel  (MIRENA ) 20 MCG/24HR IUD 1 each by  Intrauterine route once.   Yes [provider]  meloxicam  (MOBIC ) 15 MG tablet ONE TAB BY MOUTH EVERY 24 HOURS WITH A MEAL FOR 2 WEEKS, THEN ONCE EVERY 24 HOURS AS NEEDED 4/PAIN. 05/18/23  Yes Curtis Debby PARAS, MD  Multiple Vitamin tablet Take 1 tablet by mouth daily.   Yes [provider]  ondansetron  (ZOFRAN ) 4 MG tablet Take 1 tablet (4 mg total) by mouth every 8 (eight) hours as needed for nausea or vomiting. 10/03/23  Yes Willo Mini, NP  venlafaxine  XR (EFFEXOR -XR) 37.5 MG 24 hr capsule Take 1-2 capsules (37.5-75 mg total) by mouth daily with breakfast. 11/29/23  Yes Willo Mini, NP  hydrocortisone  cream 1 % Apply 1 application topically 2 (two) times daily. Patient taking differently: Apply 1 application  topically 2 (two) times daily as needed. 02/04/21   Willo Mini, NP  predniSONE  (DELTASONE ) 20 MG tablet Take 40 mg by mouth daily. 06/20/24   [provider]    Family History Family History  Problem Relation Age of Onset   Diabetes Father    Hypertension Father    Hyperlipidemia Father    Heart disease Father    Heart attack Father    Cancer Other     Social History Social History   Tobacco Use   Smoking status: Never  Smokeless tobacco: Never  Vaping Use   Vaping status: Never Used  Substance Use Topics   Alcohol use: Yes    Alcohol/week: 3.0 - 4.0 standard drinks of alcohol    Types: 3 - 4 Glasses of wine per week   Drug use: No     Allergies   Amoxicillin, Cephalexin , Clindamycin/lincomycin, and Duricef [cefadroxil]   Review of Systems Review of Systems See HPI  Physical Exam Triage Vital Signs ED Triage Vitals  Encounter Vitals Group     BP 07/01/24 0908 (!) 145/93     Girls Systolic BP Percentile --      Girls Diastolic BP Percentile --      Boys Systolic BP Percentile --      Boys Diastolic BP Percentile --      Pulse Rate 07/01/24 0908 (!) 132     Resp 07/01/24 0908 18     Temp 07/01/24 0908 98.3 F (36.8 C)      Temp Source 07/01/24 0908 Oral     SpO2 07/01/24 0908 100 %     Weight 07/01/24 0910 156 lb (70.8 kg)     Height 07/01/24 0910 5' 6 (1.676 m)     Head Circumference --      Peak Flow --      Pain Score 07/01/24 0910 8     Pain Loc --      Pain Education --      Exclude from Growth Chart --    No data found.  Updated Vital Signs BP (!) 145/93 (BP Location: Right Arm)   Pulse (!) 132   Temp 98.3 F (36.8 C) (Oral)   Resp 18   Ht 5' 6 (1.676 m)   Wt 70.8 kg   SpO2 100%   BMI 25.18 kg/m      Physical Exam Constitutional:      General: She is in acute distress.     Appearance: She is well-developed and normal weight. She is ill-appearing.     Comments: Patient is acutely uncomfortable.  Tearful.  Very stiff and guarded movements.  HENT:     Head: Normocephalic and atraumatic.     Right Ear: Tympanic membrane and ear canal normal.     Left Ear: Ear canal normal.     Ears:     Comments: Left TM is bulging    Nose: No congestion.     Mouth/Throat:     Pharynx: Posterior oropharyngeal erythema present.     Comments: Left tonsils enlarged greater than the right, mild erythema.  No exudate Eyes:     Conjunctiva/sclera: Conjunctivae normal.     Pupils: Pupils are equal, round, and reactive to light.  Neck:     Comments: Tenderness of the left cervical nodes at the angle of the jaw.  Tenderness of the left mastoid region that is quite acute.  No erythema, crepitus, warmth noted.  Neck muscles are quite taut and tender Cardiovascular:     Rate and Rhythm: Normal rate.  Pulmonary:     Effort: Pulmonary effort is normal. No respiratory distress.  Abdominal:     General: There is no distension.     Palpations: Abdomen is soft.  Musculoskeletal:        General: Normal range of motion.     Cervical back: Normal range of motion.  Lymphadenopathy:     Cervical: Cervical adenopathy present.  Skin:    General: Skin is warm and dry.  Neurological:  Mental Status: She is  alert.      UC Treatments / Results  Labs (all labs ordered are listed, but only abnormal results are displayed) Labs Reviewed - No data to display  EKG   Radiology No results found.  Procedures Procedures (including critical care time)  Medications Ordered in UC Medications - No data to display  Initial Impression / Assessment and Plan / UC Course  I have reviewed the triage vital signs and the nursing notes.  Pertinent labs & imaging results that were available during my care of the patient were reviewed by me and considered in my medical decision making (see chart for details).     Patient is here with her husband who is an Char[employer] fundraiser.  I explained to them that she needs a higher level of care with IV medication, CT scanning, lab work.  They agreed to go to [facility] where he works. Final Clinical Impressions(s) / UC Diagnoses   Final diagnoses:  Acute mastoiditis of left side     Discharge Instructions      Please go directly to [facility] for a higher level of care   ED Prescriptions   None    PDMP not reviewed this encounter.   Maranda Jamee Jacob, MD 07/01/24 [phone]

## 2024-07-01 NOTE — ED Triage Notes (Signed)
 Patient states that she was diagnosed with Mastoiditis on Thursday in her left ear.  Pain is worse, increased neck pain and heart flutters now.  Started on Doxycycline  and Vicodin no improvement.

## 2024-07-01 NOTE — ED Notes (Signed)
 Patient is being discharged from the Urgent Care and sent to the Emergency Department via pov . Per nelson md, patient is in need of higher level of care due to concern for mastoiditis. Patient is aware and verbalizes understanding of plan of care.  Vitals:   07/01/24 0908  BP: (!) 145/93  Pulse: (!) 132  Resp: 18  Temp: 98.3 F (36.8 C)  SpO2: 100%

## 2024-07-02 ENCOUNTER — Ambulatory Visit: Admitting: Medical-Surgical

## 2024-07-04 DIAGNOSIS — H6122 Impacted cerumen, left ear: Secondary | ICD-10-CM | POA: Diagnosis not present

## 2024-07-04 DIAGNOSIS — M542 Cervicalgia: Secondary | ICD-10-CM | POA: Diagnosis not present

## 2024-08-11 ENCOUNTER — Other Ambulatory Visit: Payer: Self-pay | Admitting: Medical-Surgical

## 2024-08-28 ENCOUNTER — Encounter: Payer: Self-pay | Admitting: Sports Medicine

## 2024-10-11 ENCOUNTER — Telehealth: Payer: Self-pay

## 2024-10-11 NOTE — Telephone Encounter (Signed)
 Left message for a call back about colon cancer screening. No documentation of a colonoscopy or Cologuard.

## 2024-12-07 ENCOUNTER — Encounter: Admitting: Medical-Surgical

## 2024-12-12 ENCOUNTER — Encounter: Admitting: Medical-Surgical

## 2024-12-14 ENCOUNTER — Encounter: Admitting: Medical-Surgical

## 2024-12-28 ENCOUNTER — Encounter: Admitting: Medical-Surgical

## 2025-01-14 ENCOUNTER — Encounter: Admitting: Medical-Surgical

## 2025-01-16 ENCOUNTER — Encounter: Payer: Self-pay | Admitting: Medical-Surgical

## 2025-01-16 MED ORDER — VENLAFAXINE HCL ER 75 MG PO CP24: 75.0000 mg | ORAL_CAPSULE | Freq: Every day | ORAL | 1 refills | Status: AC

## 2025-01-16 MED ORDER — VENLAFAXINE HCL ER 37.5 MG PO CP24: 37.5000 mg | ORAL_CAPSULE | Freq: Every day | ORAL | 1 refills | Status: AC

## 2025-01-21 ENCOUNTER — Encounter: Admitting: Medical-Surgical

## 2025-01-25 ENCOUNTER — Encounter: Admitting: Medical-Surgical

## 2025-02-13 ENCOUNTER — Encounter: Admitting: Medical-Surgical
